# Patient Record
Sex: Female | Born: 1990 | Race: White | Hispanic: No | Marital: Married | Smoking: Never smoker
Health system: Southern US, Community
[De-identification: ages and names within clinical notes are randomized; demographics above are authoritative.]

---
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
## 2009-01-07 ENCOUNTER — Ambulatory Visit: Payer: Self-pay | Admitting: Family Medicine

## 2009-01-07 LAB — CONVERTED CEMR LAB: TSH: 1.935 microintl units/mL (ref 0.350–4.500)

## 2009-01-08 ENCOUNTER — Ambulatory Visit: Payer: Self-pay | Admitting: Radiology

## 2009-01-08 ENCOUNTER — Ambulatory Visit (HOSPITAL_BASED_OUTPATIENT_CLINIC_OR_DEPARTMENT_OTHER): Admission: RE | Admit: 2009-01-08 | Discharge: 2009-01-08 | Payer: Self-pay | Admitting: Family Medicine

## 2009-05-14 ENCOUNTER — Ambulatory Visit: Payer: Self-pay | Admitting: Family

## 2009-05-18 ENCOUNTER — Telehealth: Payer: Self-pay | Admitting: Family

## 2009-05-19 ENCOUNTER — Encounter (INDEPENDENT_AMBULATORY_CARE_PROVIDER_SITE_OTHER): Payer: Self-pay | Admitting: *Deleted

## 2009-07-18 ENCOUNTER — Ambulatory Visit: Payer: Self-pay | Admitting: Family

## 2009-12-29 ENCOUNTER — Ambulatory Visit: Payer: Self-pay | Admitting: Family

## 2010-01-08 ENCOUNTER — Ambulatory Visit: Payer: Self-pay | Admitting: Family

## 2010-01-08 ENCOUNTER — Ambulatory Visit (HOSPITAL_BASED_OUTPATIENT_CLINIC_OR_DEPARTMENT_OTHER): Admission: RE | Admit: 2010-01-08 | Discharge: 2010-01-08 | Payer: Self-pay | Admitting: Internal Medicine

## 2010-01-08 ENCOUNTER — Ambulatory Visit: Payer: Self-pay | Admitting: Diagnostic Radiology

## 2010-01-12 LAB — CONVERTED CEMR LAB
ALT: 11 units/L (ref 0–35)
AST: 17 units/L (ref 0–37)
Albumin: 4.2 g/dL (ref 3.5–5.2)
Alkaline Phosphatase: 61 units/L (ref 39–117)
Basophils Absolute: 0 10*3/uL (ref 0.0–0.1)
Basophils Relative: 1 % (ref 0–1)
Bilirubin, Direct: 0.1 mg/dL (ref 0.0–0.3)
EBV NA IgG: 1.98 — ABNORMAL HIGH
EBV VCA IgG: 5.94 — ABNORMAL HIGH
EBV VCA IgM: 0.52
Eosinophils Absolute: 0.2 10*3/uL (ref 0.0–0.7)
Eosinophils Relative: 2 % (ref 0–5)
HCT: 39.8 % (ref 36.0–46.0)
Hemoglobin: 13.1 g/dL (ref 12.0–15.0)
Indirect Bilirubin: 0.3 mg/dL (ref 0.0–0.9)
Lymphocytes Relative: 39 % (ref 12–46)
Lymphs Abs: 3.4 10*3/uL (ref 0.7–4.0)
MCHC: 32.9 g/dL (ref 30.0–36.0)
MCV: 85.8 fL (ref 78.0–100.0)
Monocytes Absolute: 0.5 10*3/uL (ref 0.1–1.0)
Monocytes Relative: 5 % (ref 3–12)
Neutro Abs: 4.5 10*3/uL (ref 1.7–7.7)
Neutrophils Relative %: 52 % (ref 43–77)
Platelets: 271 10*3/uL (ref 150–400)
RBC: 4.64 M/uL (ref 3.87–5.11)
RDW: 13.9 % (ref 11.5–15.5)
Total Bilirubin: 0.4 mg/dL (ref 0.3–1.2)
Total Protein: 8.1 g/dL (ref 6.0–8.3)
WBC: 8.7 10*3/uL (ref 4.0–10.5)

## 2010-01-13 ENCOUNTER — Telehealth: Payer: Self-pay | Admitting: Family

## 2010-01-13 ENCOUNTER — Telehealth: Payer: Self-pay | Admitting: Gastroenterology

## 2010-01-15 ENCOUNTER — Telehealth: Payer: Self-pay | Admitting: Family

## 2010-01-19 ENCOUNTER — Ambulatory Visit: Payer: Self-pay | Admitting: Family

## 2010-01-22 ENCOUNTER — Telehealth (INDEPENDENT_AMBULATORY_CARE_PROVIDER_SITE_OTHER): Payer: Self-pay | Admitting: *Deleted

## 2010-01-22 ENCOUNTER — Telehealth: Payer: Self-pay | Admitting: Family

## 2010-01-29 ENCOUNTER — Ambulatory Visit: Payer: Self-pay | Admitting: Gastroenterology

## 2010-01-29 ENCOUNTER — Ambulatory Visit (HOSPITAL_COMMUNITY): Admission: RE | Admit: 2010-01-29 | Discharge: 2010-01-29 | Payer: Self-pay | Admitting: Gastroenterology

## 2010-01-29 ENCOUNTER — Encounter: Payer: Self-pay | Admitting: Nurse Practitioner

## 2010-05-13 ENCOUNTER — Ambulatory Visit: Payer: Self-pay | Admitting: Family

## 2010-05-13 DIAGNOSIS — L732 Hidradenitis suppurativa: Secondary | ICD-10-CM | POA: Insufficient documentation

## 2010-05-13 LAB — CONVERTED CEMR LAB: Beta hcg, urine, semiquantitative: NEGATIVE

## 2010-06-08 ENCOUNTER — Telehealth: Payer: Self-pay | Admitting: Family

## 2010-06-19 ENCOUNTER — Ambulatory Visit
Admission: RE | Admit: 2010-06-19 | Discharge: 2010-06-19 | Payer: Self-pay | Source: Home / Self Care | Attending: Family | Admitting: Family

## 2010-06-19 DIAGNOSIS — J329 Chronic sinusitis, unspecified: Secondary | ICD-10-CM | POA: Insufficient documentation

## 2010-06-19 DIAGNOSIS — N751 Abscess of Bartholin's gland: Secondary | ICD-10-CM

## 2010-06-23 ENCOUNTER — Ambulatory Visit: Admit: 2010-06-23 | Payer: Self-pay | Admitting: Internal Medicine

## 2010-06-23 ENCOUNTER — Telehealth: Payer: Self-pay | Admitting: Internal Medicine

## 2010-07-21 NOTE — Assessment & Plan Note (Signed)
Summary: still sick on stomach maybe inner ear inf?/dt--Rm 5   Vital Signs:  Patient profile:   20 year old female Menstrual status:  regular Height:      67 inches Weight:      133 pounds BMI:     20.91 Temp:     98.0 degrees F oral Pulse rate:   72 / minute Pulse rhythm:   regular Resp:     16 per minute BP sitting:   112 / 80  (right arm) Cuff size:   regular  Vitals Entered By: Mervin Kung CMA Duncan Dull) (January 08, 2010 9:52 AM) CC: Room 5  Pt states she is still nauseated.  Also has intermittent ringing in her ears and has left ear pain this morning. Is Patient Diabetic? No   Primary Care Provider:  Lemont Fillers FNP  CC:  Room 5  Pt states she is still nauseated.  Also has intermittent ringing in her ears and has left ear pain this morning.Marland Kitchen  History of Present Illness: Heather Sparks is a 20 year old female who presents today with ongoing complaints of nausea.  Notes that nausea is worse at work and with activity.  Often worse after she eats.  She has not vomitted, had diarrhea or fever. Denies GERD symptoms. Though feels sweaty.  She has had c/o of abdominal pressure- but not clear pain. Abdominal discomfort is worse when she lays on her side.  She also complains of left ear pain.  She did not see ENT for this pain.    Allergies: 1)  ! Sulfa  Physical Exam  General:  Well-developed,well-nourished,in no acute distress; alert,appropriate and cooperative throughout examination Head:  Normocephalic and atraumatic without obvious abnormalities. No apparent alopecia or balding. Ears:  L TM partially occluded by cerumen- area that is visualized appears normal. L TM normal Mouth:  Oral mucosa and oropharynx without lesions or exudates.  Teeth in good repair. Lungs:  Normal respiratory effort, chest expands symmetrically. Lungs are clear to auscultation, no crackles or wheezes. Heart:  Normal rate and regular rhythm. S1 and S2 normal without gallop, murmur, click, rub or  other extra sounds. Abdomen:  Mild epigastric discomfort to deep palpation.  Mild discomfort to palpation of left mid abdomen.  Abdomen is soft, no guarding, + BS   Impression & Recommendations:  Problem # 1:  NAUSEA (ICD-787.02) Assessment Unchanged Will check LFT's, EBV panel, CBC and an abdominal ultrasound.  I am concerned about possible mono given fatigue, cervical adenopathy and L sided abdominal discomfort.  Check EBV panel.  Pt reports that she has never been sexually active.  Her updated medication list for this problem includes:    Zofran 4 Mg Tabs (Ondansetron hcl) ..... One tab by mouth every 8 hours as needed.  Orders: Misc. Referral (Misc. Ref) TLB-Hepatic/Liver Function Pnl (80076-HEPATIC) TLB-CBC Platelet - w/Differential (85025-CBCD) T-Epstein Barr Virus Antibody Panel I ([id number])  Problem # 2:  EAR PAIN, LEFT (ICD-388.70) Assessment: Unchanged Pt never went to ENT back in february- apparently scheduler could not reach her.  Will refer again.   Orders: ENT Referral (ENT)  Complete Medication List: 1)  Yaz 3-0.02 Mg Tabs (Drospirenone-ethinyl estradiol) .Marland Kitchen.. 1 tab by mouth daily as directed 2)  Zofran 4 Mg Tabs (Ondansetron hcl) .... One tab by mouth every 8 hours as needed.  Patient Instructions: 1)  You will be contacted about your referral to ENT. 2)  Please complete your lab work and ultrasound downstairs. 3)  Follow up  in 2 weeks.  Current Allergies (reviewed today): ! SULFA

## 2010-07-21 NOTE — Assessment & Plan Note (Signed)
Summary: NAUSEA X1 MONTH           (NEW TO GI)       Heather Sparks   History of Present Illness Visit Type: consult  Primary GI MD: Sheryn Bison MD FACP FAGA Primary Kohle Winner: Lemont Fillers FNP Requesting Houa Ackert: Lemont Fillers FNP Chief Complaint: Nausea, chest pain, headaches, and constipation  History of Present Illness:   Patient is a 20 year old female, new to GI, who presents with approx 2 month history of nausea. U/S, CBC, LFTs normal. Nauseated almost everyday, no vomiting. No dizziness. No weight loss. Nausea worse after meals. No GERD symptoms. Takes Advill once a week for headaches. Has taken Yaz for two years. Has never been sexually active. Started Vit C a week ago.  No other vitamins or herbs. Bowel movements normal.    GI Review of Systems    Reports nausea.      Denies abdominal pain, acid reflux, belching, bloating, chest pain, dysphagia with liquids, dysphagia with solids, heartburn, loss of appetite, vomiting, vomiting blood, weight loss, and  weight gain.        Denies anal fissure, black tarry stools, change in bowel habit, constipation, diarrhea, diverticulosis, fecal incontinence, heme positive stool, hemorrhoids, irritable bowel syndrome, jaundice, light color stool, liver problems, rectal bleeding, and  rectal pain. 0i   Current Medications (verified): 1)  Yaz 3-0.02 Mg Tabs (Drospirenone-Ethinyl Estradiol) .Marland Kitchen.. 1 Tab By Mouth Daily As Directed  Allergies (verified): 1)  ! Sulfa  Past History:  Past Medical History: G0 Unremarkable  Past Surgical History: Reviewed history from 01/07/2009 and no changes required. none  Family History: mother hypothyroidism father healthy 2 sisters healthy Family History of Diabetes: MGM Family History of Heart Disease: Paternal Side  No FH of Colon Cancer:  Social History: Reviewed history from 01/07/2009 and no changes required. Starting college at [employer]. Denies being sexually  active. Physically active, healthy diet. Denies ETOH or drug use.  Review of Systems       The patient complains of back pain, depression-new, fatigue, headaches-new, and urination - excessive.  The patient denies allergy/sinus, anemia, anxiety-new, arthritis/joint pain, blood in urine, breast changes/lumps, change in vision, confusion, cough, coughing up blood, fainting, fever, hearing problems, heart murmur, heart rhythm changes, itching, menstrual pain, muscle pains/cramps, night sweats, nosebleeds, pregnancy symptoms, shortness of breath, skin rash, sleeping problems, sore throat, swelling of feet/legs, swollen lymph glands, thirst - excessive , urination - excessive , urination changes/pain, urine leakage, vision changes, and voice change.    Vital Signs:  Patient profile:   20 year old female Menstrual status:  regular Height:      67 inches Weight:      134 pounds BMI:     21.06 BSA:     1.71 Pulse rate:   64 / minute Pulse rhythm:   regular BP sitting:   98 / 60  (left arm) Cuff size:   regular  Vitals Entered By: Ok Anis CMA (January 29, 2010 9:39 AM)  Physical Exam  General:  Well developed, well nourished, no acute distress. Head:  Normocephalic and atraumatic. Eyes:  Conjunctiva pink, no icterus.  Neck:  no obvious masses  Lungs:  Clear throughout to auscultation. Heart:  Regular rate and rhythm; no murmurs, rubs,  or bruits. Abdomen:  Abdomen soft, nontender, nondistended. No obvious masses or hepatomegaly.Normal bowel sounds.  Msk:  . Extremities:  No palmar erythema, no edema.  Neurologic:  Alert and  oriented x4;  grossly normal neurologically. Skin:  Intact without significant lesions or rashes. Cervical Nodes:  No significant cervical adenopathy. Psych:  Alert and cooperative. Normal mood and affect.   Impression & Recommendations:  Problem # 1:  NAUSEA (ICD-787.02) Assessment Deteriorated Two month history of nausea without vomiting. Some, but no  significant use of NSAIDS. No associated dizziness. She has never been sexually active so not pregnant. On birth control pills for two years without prior nausea. U/S and labs are normal. Patient is moving to [place] on Sunday so we are extremely limited in the amount of time available for workup. Dr. Arlyce Dice has offered to do an EGD today. If negative, patient can return to [facility] for further workup or establish care with a GI in [place] where she will be attending college;.   Orders: ZEGD (ZEGD)  Patient Instructions: 1)  Instructed the pt to arrive at [facility] at 11:30 AM. 2)  Have nothing to eat or drink until after your procedure today.

## 2010-07-21 NOTE — Letter (Signed)
Summary: EGD Instructions  [facility]  [address] [place], [place] [address]   Phone: [phone]  Fax: [phone]       Heather Sparks    1991/02/08    MRN: [id number]       Procedure Day /Date :01-29-10     Arrival Time: 11:30 AM     Procedure Time:  12:30 PM      Location of Procedure:                     X  _ [facility] ( Outpatient Registration) PREPARATION FOR ENDOSCOPY   On 8-11-11THE DAY OF THE PROCEDURE:  1.   No solid foods, milk or milk products are allowed after midnight the night before your procedure.  2.   Do not drink anything colored red or purple.  Avoid juices with pulp.  No orange juice.  3.  Do not drink anything until after the procedure today.  Arrive at 11:30 AM to [facility] patient check in.                                                                                                 CLEAR LIQUIDS INCLUDE: Water Jello Ice Popsicles Tea (sugar ok, no milk/cream) Powdered fruit flavored drinks Coffee (sugar ok, no milk/cream) Gatorade Juice: apple, white grape, white cranberry  Lemonade Clear bullion, consomm, broth Carbonated beverages (any kind) Strained chicken noodle soup Hard Candy   MEDICATION INSTRUCTIONS  Unless otherwise instructed, you should take regular prescription medications with a small sip of water as early as possible the morning of your procedure.         OTHER INSTRUCTIONS  You will need a responsible adult at least 21 years of age to accompany you and drive you home.   This person must remain in the waiting room during your procedure.  Wear loose fitting clothing that is easily removed.  Leave jewelry and other valuables at home.  However, you may wish to bring a book to read or an iPod/MP3 player to listen to music as you wait for your procedure to start.  Remove all body piercing jewelry and leave at home.  Total time from sign-in until discharge is approximately 2-3  hours.  You should go home directly after your procedure and rest.  You can resume normal activities the day after your procedure.  The day of your procedure you should not:   Drive   Make legal decisions   Operate machinery   Drink alcohol   Return to work  You will receive specific instructions about eating, activities and medications before you leave.    The above instructions have been reviewed and explained to me by   _______________________    I fully understand and can verbalize these instructions _____________________________ Date _________

## 2010-07-21 NOTE — Assessment & Plan Note (Signed)
Summary: EAR ACHE AND CANT HEAR OUT OF ONE EAR/MHF   Vital Signs:  Patient profile:   20 year old female Menstrual status:  regular Weight:      134 pounds Temp:     98.1 degrees F oral BP sitting:   112 / 68  (left arm)  Vitals Entered By: Doristine Devoid (July 18, 2009 8:37 AM) CC: L ear ache having trouble hearing   20db HL: Left  500 hz: No Response 1000 hz: 20db 2000 hz: 20db 4000 hz: 20db Right  500 hz: No Response 1000 hz: No Response 2000 hz: 20db 4000 hz: 20db  25db HL: Left  500 hz: No Response 1000 hz: 25db 2000 hz: 25db 4000 hz: 25db Right  500 hz: No Response 1000 hz: 25db 2000 hz: 25db 4000 hz: 25db    Primary Care Provider:  Seymour Bars DO  CC:  L ear ache having trouble hearing .  History of Present Illness: Heather Sparks is a 20 year old female who presents today with c/o of left ear pain and difficulty hearing out of her left ear.  She was seen in the end of November for the same symptoms and was treated with antibiotics.  This did not significantly improve her hearing.  Today she is having difficulty hearing out of her left ear as well as having pain.  Notes that she is also scheduled to have her wisdom teeth extracted in the near future- she is having jaw pain as well.    Allergies: 1)  ! Sulfa  Review of Systems       Denies fever of ear drainage  Physical Exam  General:  Well-developed,well-nourished,in no acute distress; alert,appropriate and cooperative throughout examination Ears:  L canal with thick purulence on walls- pain with light manipulation of left Pinna, unable to visualize L TM due to discomfort and narrowing of canal.  R TM partially occluded by cerumen, no erythema noted.   Mouth:  Oral mucosa and oropharynx without lesions or exudates.  Teeth in good repair. Neck:  No deformities, masses, or tenderness noted. Lungs:  Normal respiratory effort, chest expands symmetrically. Lungs are clear to auscultation, no crackles or  wheezes. Heart:  Normal rate and regular rhythm. S1 and S2 normal without gallop, murmur, click, rub or other extra sounds.   Impression & Recommendations:  Problem # 1:  EXTERNAL OTITIS (ICD-380.10) Assessment New  I had a difficult time visualizing L TM due to ear canal narrowing and patient discomfort.  Will treat empirically for OM as well.  Patient instructed to use ciprodex drops in left ear two times a day.  Will also refer to ENT for further evaluation as patient's c/o of left ear pain and hearing difficulties have been ongoing x 2 months.  Patient instructed to call if symptoms worsen or do not improve.   Her updated medication list for this problem includes:    Ciprodex 0.3-0.1 % Susp (Ciprofloxacin-dexamethasone) .Marland KitchenMarland KitchenMarland KitchenMarland Kitchen 4 drops in each ear twice daily x 7 days  Orders: Audiometry [phone]) ENT Referral (ENT)  Complete Medication List: 1)  Yaz 3-0.02 Mg Tabs (Drospirenone-ethinyl estradiol) .Marland Kitchen.. 1 tab by mouth daily as directed 2)  Augmentin 500-125 Mg Tabs (Amoxicillin-pot clavulanate) .... One tab by mouth two times a day x 10 days 3)  Ciprodex 0.3-0.1 % Susp (Ciprofloxacin-dexamethasone) .... 4 drops in each ear twice daily x 7 days  Patient Instructions: 1)  You will be contacted about your appointment with the Ear/Nose/throat specialist.   2)  Antibiotics can reduce the effectiveness of your birth control pill.  Use condoms as a back up form of birth control and for protection against STD's.    Prescriptions: YAZ 3-0.02 MG TABS (DROSPIRENONE-ETHINYL ESTRADIOL) 1 tab by mouth daily as directed  #3 x 1   Entered and Authorized by:   Lemont Fillers FNP   Signed by:   Doristine Devoid on 07/18/2009   Method used:   [facility] to        [facility] [phone]* (retail)       [address]       [place], [place]  [address]       Ph: [phone]       Fax: [phone]   RxID:   [id number] CIPRODEX 0.3-0.1 % SUSP  (CIPROFLOXACIN-DEXAMETHASONE) 4 drops in each ear twice daily x 7 days  #1 x 0   Entered and Authorized by:   Lemont Fillers FNP   Signed by:   Doristine Devoid on 07/18/2009   Method used:   Electronically to        [facility] [phone]* (retail)       [address]       [place], [place]  [address]       Ph: [phone]       Fax: [phone]   RxID:   [id number] AUGMENTIN 500-125 MG TABS (AMOXICILLIN-POT CLAVULANATE) one tab by mouth two times a day x 10 days  #20 x 0   Entered and Authorized by:   Lemont Fillers FNP   Signed by:   Doristine Devoid on 07/18/2009   Method used:   Electronically to        [facility] [phone]* (retail)       [address]       [place], [place]  [address]       Ph: [phone]       Fax: [phone]   RxID:   [id number]

## 2010-07-21 NOTE — Progress Notes (Signed)
Summary: Triage  Phone Note From Other Clinic   Caller: [facility] @ Dr Artist Pais [phone] Reason for Call: Schedule Patient Appt Summary of Call: Nausea X1 month, getting worse. Initial call taken by: Leanor Kail [facility],  January 22, 2010 11:05 AM  Follow-up for Phone Call        Pt. can see Mike Gip PAC on 01-23-10 at 9:30am. Myriam Jacobson will advise pt. of appt./medlist/copay/cx.policy. Follow-up by: Laureen Ochs LPN,  January 22, 2010 12:26 PM     Appended Document: Triage Per Myriam Jacobson, pt. cannot be seen until 01-29-10, she is going out of town. She will see Willette Cluster NP on 01-29-10 at 9:30am.

## 2010-07-21 NOTE — Assessment & Plan Note (Signed)
Summary: wants to change bcp / tf,cma--Rm 4   Vital Signs:  Patient profile:   20 year old female Menstrual status:  regular Height:      67 inches Weight:      133.50 pounds BMI:     20.98 Temp:     97.7 degrees F oral Pulse rate:   72 / minute Pulse rhythm:   regular Resp:     18 per minute BP sitting:   90 / 70  (right arm) Cuff size:   regular  Vitals Entered By: Mervin Kung CMA Duncan Dull) (May 13, 2010 3:44 PM) CC: Pt states she has stopped Yaz x 2 months. Nausea has resolved. Would like to start new BCP med. Is Patient Diabetic? No Pain Assessment Patient in pain? no        Primary Care Provider:  Lemont Fillers FNP  CC:  Pt states she has stopped Yaz x 2 months. Nausea has resolved. Would like to start new BCP med.Marland Kitchen  History of Present Illness: Ms Sibyl Parr is a 20 year old female who presents today requesting a change in her OCP.  She reports that she has been off of YAZ x 2  months.  Prior to that time she was experiencing nausea.  Went off the YAZ briefly, nausea resolved.  Then resumed and within 5 days was having nausea again.  LMP started 6 days ago- just ended.  Reports that she is not sexually active.  Uses birth control to help with her acne and also "Just in case" she becomes sexually active.     Also has question about pimples that she gets under her arms.    Allergies: 1)  ! Sulfa  Past History:  Past Medical History: Last updated: 01/29/2010 G0 Unremarkable  Past Surgical History: Last updated: 01/07/2009 none  Review of Systems       see HPI  Physical Exam  General:  Well-developed,well-nourished,in no acute distress; alert,appropriate and cooperative throughout examination Head:  Normocephalic and atraumatic without obvious abnormalities. No apparent alopecia or balding. Lungs:  Normal respiratory effort, chest expands symmetrically. Lungs are clear to auscultation, no crackles or wheezes. Heart:  Normal rate and regular  rhythm. S1 and S2 normal without gallop, murmur, click, rub or other extra sounds. Skin:  small pimple noted in left axilla.     Impression & Recommendations:  Problem # 1:  CONTRACEPTIVE MANAGEMENT (ICD-V25.09) Assessment Comment Only Urine pregancy test is negative.  Will try microgestin which has lower estrogen than the yaz.  Pt to calll if she develops nausea on this pill. Orders: Urine Pregnancy Test  (57846)  Problem # 2:  HIDRADENITIS SUPPURATIVA (ICD-705.83) Assessment: Comment Only Mild, small pimple in left axilla does not warrant abx at this time, however patient was instructed to call us if area becomes larger or more painful.    Complete Medication List: 1)  Microgestin Fe 1.5/30 1.5-30 Mg-mcg Tabs (Norethin ace-eth estrad-fe) .... Take as directed  Patient Instructions: 1)  Start your pill today. 2)  Call if you develop nausea while taking this medication. 3)  Use back up birth control this cycle.  4)  Follow up in 6 months, sooner if problems or concerns. Prescriptions: MICROGESTIN FE 1.5/30 1.5-30 MG-MCG TABS (NORETHIN ACE-ETH ESTRAD-FE) take as directed  #1 x 5   Entered and Authorized by:   Lemont Fillers FNP   Signed by:   Lemont Fillers FNP on 05/13/2010   Method used:   Electronically to  [facility] [phone]* (retail)       [address]       [place], [place]  [address]       Ph: [phone]       Fax: [phone]   RxID:   [id number]    Orders Added: 1)  Urine Pregnancy Test  [81025] 2)  Est. Patient Level III [[address]]    Current Allergies (reviewed today): ! SULFA  Laboratory Results   Urine Tests   Date/Time Reported: Mervin Kung CMA Duncan Dull)  May 13, 2010 4:03 PM     Urine HCG: negative

## 2010-07-21 NOTE — Assessment & Plan Note (Signed)
Summary: NEEDS REFILL ON  BCP  /PAP-Rm 4   Vital Signs:  Patient profile:   20 year old female Menstrual status:  regular Height:      67 inches Weight:      130.50 pounds BMI:     20.51 Temp:     97.8 degrees F oral Pulse rate:   78 / minute Pulse rhythm:   regular Resp:     18 per minute BP sitting:   118 / 80  (right arm) Cuff size:   regular  Vitals Entered By: Heather Sparks) (December 29, 2009 1:37 PM) CC: Pt needs refill on Yaz (needs generic).  Needs pap smear.   Also having n/v & diarrhea x 2 days. Is Patient Diabetic? No Pain Assessment Patient in pain? no      Comments Pt has completed Augmentin and Ciprodex.   Primary Care Provider:  Seymour Bars DO  CC:  Pt needs refill on Yaz (needs generic).  Needs pap smear.   Also having n/v & diarrhea x 2 days.Marland Kitchen  History of Present Illness: Heather Sparks is a 20 year old female who presents today with complaint of nausea, vomitting and diarrhea x 2 days.   Boyfriend also is sick.  She did keep soup down last night,  yesterday had 2-3 episodes of diarrhea yesterday, once today.  Denies fever though has felt "hot and cold."  Allergies: 1)  ! Sulfa  Physical Exam  General:  Tired appearing white female in NAD Lungs:  Normal respiratory effort, chest expands symmetrically. Lungs are clear to auscultation, no crackles or wheezes. Heart:  Normal rate and regular rhythm. S1 and S2 normal without gallop, murmur, click, rub or other extra sounds. Abdomen:  Bowel sounds positive but hypoactive, abdomen soft and with mild generalized tenderness.  No masses, organomegaly or hernias noted.   Impression & Recommendations:  Problem # 1:  GASTROENTERITIS, ACUTE (ICD-558.9) Assessment New Will treat with zofran, encouraged fluids.  Patient with known sick contacts.  Suspect acute viral gastroenteritis Her updated medication list for this problem includes:    Zofran 4 Mg Tabs (Ondansetron hcl) ..... One tab by mouth every 8  hours as needed.  Problem # 2:  CONTRACEPTIVE MANAGEMENT (ICD-V25.09) Assessment: Comment Only Patient has never been sexually active.  New guidelines recommend starting paps at age 2.  Using Yaz, "just in case" she becomes sexually active and to help regulate her periods.  Refill provided.  Complete Medication List: 1)  Yaz 3-0.02 Mg Tabs (Drospirenone-ethinyl estradiol) .Marland Kitchen.. 1 tab by mouth daily as directed 2)  Zofran 4 Mg Tabs (Ondansetron hcl) .... One tab by mouth every 8 hours as needed.  Patient Instructions: 1)  Call if your symptoms worsen or have not improved in 48 hours. 2)  Drink LOTS of fluid. Prescriptions: ZOFRAN 4 MG TABS (ONDANSETRON HCL) one tab by mouth every 8 hours as needed.  #15 x 0   Entered and Authorized by:   Heather Sparks   Signed by:   Heather Sparks on 12/29/2009   Method used:   Electronically to        [facility] [phone]* (retail)       [address]       [place], [place]  [address]       Ph: [phone]       Fax: [phone]   RxID:   [id number] YAZ 3-0.02 MG  TABS (DROSPIRENONE-ETHINYL ESTRADIOL) 1 tab by mouth daily as directed  #3 x 3   Entered and Authorized by:   Heather Sparks   Signed by:   Heather Sparks on 12/29/2009   Method used:   Electronically to        [facility] [phone]* (retail)       [address]       [place], [place]  [address]       Ph: [phone]       Fax: [phone]   RxID:   [id number]   Current Allergies (reviewed today): ! SULFA

## 2010-07-21 NOTE — Progress Notes (Signed)
Summary: Wants to see GI today  Phone Note Call from Patient Call back at [phone] cell   Caller: Patient Summary of Call: Patient called stating that she is feeling worse now.  Has nausea and headache. Wants to know if we can get her in with GI group today. Nicki Guadalajara Fergerson CMA Duncan Dull)  January 22, 2010 8:59 AM   Follow-up for Phone Call        [facility] appt August 5 @ 9:30, called pt she is going out of town until Thursday , Appt  August 11 @ 9:30 pt notified  Follow-up by: Darral Dash,  January 22, 2010 12:32 PM

## 2010-07-21 NOTE — Progress Notes (Signed)
Summary: Appt with Dr Christella Hartigan  Phone Note Call from Patient   Caller: Patient Summary of Call: Pt called to let us know she is cancelling appt with  Dr Christella Hartigan Initial call taken by: Lannette Donath,  January 15, 2010 3:11 PM

## 2010-07-21 NOTE — Progress Notes (Signed)
  Phone Note Outgoing Call      

## 2010-07-21 NOTE — Procedures (Signed)
Summary: Upper Endoscopy  Patient: Heather Sparks Note: All result statuses are Final unless otherwise noted.  Tests: (1) Upper Endoscopy (EGD)   EGD Upper Endoscopy       DONE     [facility]     [address] [place], [place]  [address]           ENDOSCOPY PROCEDURE REPORT           PATIENT:  Heather, Sparks  MR#:  [id number]     BIRTHDATE:  September 15, 1990, 18 yrs. old  GENDER:  female           ENDOSCOPIST:  Barbette Hair. Arlyce Dice, MD     Referred by:           PROCEDURE DATE:  01/29/2010     PROCEDURE:  EGD, diagnostic     ASA CLASS:  Class I     INDICATIONS:  nausea           MEDICATIONS:   Fentanyl 75 mcg IV, Versed 10 mg IV, Benadryl 25 mg     IV, glycopyrrolate (Robinal) 0.2 mg IV     TOPICAL ANESTHETIC:  Cetacaine Spray           DESCRIPTION OF PROCEDURE:   After the risks benefits and     alternatives of the procedure were thoroughly explained, informed     consent was obtained.  The Pentax Gastroscope [id number] endoscope     was introduced through the mouth and advanced to the third portion     of the duodenum, without limitations.  The instrument was slowly     withdrawn as the mucosa was fully examined.     <<PROCEDUREIMAGES>>           The upper, middle, and distal third of the esophagus were     carefully inspected and no abnormalities were noted. The z-line     was well seen at the GEJ. The endoscope was pushed into the fundus     which was normal including a retroflexed view. The antrum,gastric     body, first and second part of the duodenum were unremarkable (see     image1, image2, image3, image5, image6, image7, and image8).     Retroflexed views revealed no abnormalities.    The scope was then     withdrawn from the patient and the procedure completed.     COMPLICATIONS:  None           ENDOSCOPIC IMPRESSION:     1) Normal EGD     RECOMMENDATIONS:     1) Call office next 2-3 days to schedule an office appointment     for 1 month     2) hold  Yaz for 1 week           REPEAT EXAM:  No           ______________________________     Barbette Hair. Arlyce Dice, MD           CC:  Sandford Craze FNP, Sheryn Bison, MD           n.     Rosalie Doctor:   Barbette Hair. Kaplan at 01/29/2010 01:08 PM           Blanchie Serve, [id number]  Note: An exclamation mark (!) indicates a result that was not dispersed into the flowsheet. Document Creation Date: 01/29/2010 1:10 PM _______________________________________________________________________  (1) Order result status: Final Collection or observation date-time: 01/29/2010  13:02 Requested date-time:  Receipt date-time:  Reported date-time:  Referring Physician:   Ordering Physician: Melvia Heaps [phone]) Specimen Source:  Source: Launa Grill Order Number: [id number] Lab site:

## 2010-07-21 NOTE — Progress Notes (Signed)
Summary: triage  Phone Note From Other Clinic Call back at [phone]   Caller: Myriam Jacobson [facility] Call For: Dr. Christella Hartigan Reason for Call: Schedule Patient Appt Summary of Call: Dr. Artist Pais would like pt worked in for nausea x 1 month Initial call taken by: Vallarie Mare,  January 13, 2010 3:09 PM  Follow-up for Phone Call        Dr.Yoo's office ntfd. that when supervising schedule becomes avaiable at the end of the wk. we will call them and set up appt. as Dr.Eulah Walkup was the requsted Dr. and if he is not available soon they will ask the dr.if someone else can see pt. Follow-up by: Teryl Lucy RN,  January 14, 2010 9:12 AM  Additional Follow-up for Phone Call Additional follow up Details #1::        Pt scheduled with Dr Christella Hartigan 01/16/10  Myriam Jacobson will notify pt Additional Follow-up by: Chales Abrahams CMA (AAMA),  January 15, 2010 10:02 AM

## 2010-07-21 NOTE — Procedures (Signed)
Summary: Instructions for procedure/[place]  Instructions for procedure/[place]   Imported By: Sherian Rein 02/02/2010 09:42:18  _____________________________________________________________________  External Attachment:    Type:   Image     Comment:   External Document

## 2010-07-21 NOTE — Assessment & Plan Note (Signed)
Summary: 2 week follow up/mhf   Vital Signs:  Patient profile:   20 year old female Menstrual status:  regular Weight:      136 pounds BMI:     21.38 Temp:     98.5 degrees F oral Pulse rate:   66 / minute Pulse rhythm:   regular Resp:     16 per minute BP sitting:   100 / 70  (left arm) Cuff size:   regular  Vitals Entered By: Glendell Docker CMA (January 19, 2010 11:09 AM) CC: 1 Week Follow up Is Patient Diabetic? No Pain Assessment Patient in pain? no       Does patient need assistance? Functional Status Self care Ambulation Normal Comments symptoms improved no concerns   Primary Care Provider:  Lemont Fillers FNP  CC:  1 Week Follow up.  History of Present Illness: Ms Heather Sparks is a 20 year old female who presents today for follow up of her nausea.  Last visit she noted an approximately 1 month history of nausea.  Abdominal ultrasounds and lab work was unremarkable.  She was referred to GI.  Pt cancelled GI apt.  Notes improvement in her nausea since she has made arrangement for air conditioning in her room.    Ear pain is improved,  hearing issues are improved.  Notes that she saw ENT . and had cerumen removed and was given rx for an otic antibiotic.    Preventive Screening-Counseling & Management  Alcohol-Tobacco     Smoking Status: never  Allergies: 1)  ! Sulfa  Social History: Smoking Status:  never  Physical Exam  General:  Well-developed,well-nourished,in no acute distress; alert,appropriate and cooperative throughout examination Head:  Normocephalic and atraumatic without obvious abnormalities. No apparent alopecia or balding. Ears:  External ear exam shows no significant lesions or deformities.  Otoscopic examination reveals clear canals, tympanic membranes are intact bilaterally without bulging, retraction, inflammation or discharge. Hearing is grossly normal bilaterally. Lungs:  Normal respiratory effort, chest expands symmetrically. Lungs are  clear to auscultation, no crackles or wheezes. Heart:  Normal rate and regular rhythm. S1 and S2 normal without gallop, murmur, click, rub or other extra sounds.   Impression & Recommendations:  Problem # 1:  NAUSEA (ICD-787.02) Assessment Improved Symptoms improved since patient has started air conditioning her bedroom.  Recommended that she stay well hydrated and that she call if she develops recurrent nausea.  The following medications were removed from the medication list:    Zofran 4 Mg Tabs (Ondansetron hcl) ..... One tab by mouth every 8 hours as needed.  Problem # 2:  EAR PAIN, LEFT (ICD-388.70) Assessment: Improved Saw ENT, resolved.   Complete Medication List: 1)  Yaz 3-0.02 Mg Tabs (Drospirenone-ethinyl estradiol) .Marland Kitchen.. 1 tab by mouth daily as directed  Patient Instructions: 1)  Please call if you develop recurrent nausea. 2)  Stay well hydrated.  Current Allergies (reviewed today): ! SULFA

## 2010-07-23 ENCOUNTER — Telehealth: Payer: Self-pay | Admitting: Family

## 2010-07-23 NOTE — Assessment & Plan Note (Signed)
Summary: FOLLOW UP/MHF--Rm 5   Vital Signs:  Patient profile:   20 year old female Menstrual status:  regular LMP:     06/15/2010 Height:      67 inches Weight:      134 pounds BMI:     21.06 Temp:     97.8 degrees F oral Pulse rate:   72 / minute Pulse rhythm:   regular Resp:     16 per minute BP sitting:   82 / 62  (right arm) Cuff size:   regular  Vitals Entered By: Mervin Kung CMA Duncan Dull) (June 19, 2010 9:44 AM) CC: Pt states she noticed a ?cyst in her vagina x 2 days. Is Patient Diabetic? No Pain Assessment Patient in pain? no      Comments Pt agrees all med doses and directions are correct. Nicki Guadalajara Fergerson CMA Duncan Dull)  June 19, 2010 9:49 AM  LMP (date): 06/15/2010     Enter LMP: 06/15/2010   Primary Care Provider:  Lemont Fillers FNP  CC:  Pt states she noticed a ?cyst in her vagina x 2 days.Marland Kitchen  History of Present Illness: Ms.  Heather Sparks is a 20 year old female who presents today for followup.  #1. Contraceptive management-the patient was placed on Microgestin last visit. She had previously been on gas and had issues with chronic nausea. She reports that she has been tolerating this without difficulty.  #2. Vaginal cyst-the patient notes a questionable cyst in the vaginal area which has been present for approximately one week. She does not note significant pain except when trying to place a tampon. she has had one sexual partner since her last visit.  #3.Sinus drainage-the patient notes that she has had a lot of mucus in the back of her throat hurting down her sinuses. She denies any associated fever.  Allergies: 1)  ! Sulfa  Past History:  Past Medical History: Last updated: 01/29/2010 G0 Unremarkable  Review of Systems       see history of present illness  Physical Exam  General:  Well-developed,well-nourished,in no acute distress; alert,appropriate and cooperative throughout examination Eyes:  PERRLA sclera are clear Ears:   External ear exam shows no significant lesions or deformities.  Otoscopic examination reveals clear canals, tympanic membranes are intact bilaterally without bulging, retraction, inflammation or discharge. Hearing is grossly normal bilaterally. Mouth:  Oral mucosa and oropharynx without lesions or exudates.  Teeth in good repair. Lungs:  Normal respiratory effort, chest expands symmetrically. Lungs are clear to auscultation, no crackles or wheezes. Heart:  Normal rate and regular rhythm. S1 and S2 normal without gallop, murmur, click, rub or other extra sounds. Genitalia:  the patient is noted to have an approximately 1.5 cm area of fluctuance at the base of the right labia by the introitus. This is minimally tender and there is no associated erythema.   Impression & Recommendations:  Problem # 1:  ABSCESS OF BARTHOLINS GLAND (ICD-616.3) Assessment New suspect early abscess a Bartholin's gland. She does not have significant associated erythema there is no drainage. We'll plan treatment with doxycycline and will refer to GYN for possible I&D. I have advised her in the meantime to do warm tub tub soaks twice daily. Orders: Gynecologic Referral (Gyn)  Problem # 2:  CONTRACEPTIVE MANAGEMENT (ICD-V25.09) Assessment: Comment Only the patient is tolerating Microgestin without difficulty. I did advise her to use backup protection during and after use of doxycycline.  Problem # 3:  SINUSITIS (ICD-473.9) Assessment: New she may have an early sinusitis  this should be covered by the doxycycline which we are using to treat #1. Her updated medication list for this problem includes:    Doxycycline Hyclate 100 Mg Caps (Doxycycline hyclate) ..... One tablet by mouth two times a day for 7 days  Complete Medication List: 1)  Microgestin Fe 1.5/30 1.5-30 Mg-mcg Tabs (Norethin ace-eth estrad-fe) .... Take as directed 2)  Doxycycline Hyclate 100 Mg Caps (Doxycycline hyclate) .... One tablet by mouth two times a  day for 7 days  Patient Instructions: 1)  You will be contacted about your referral to GYN. Prescriptions: DOXYCYCLINE HYCLATE 100 MG CAPS (DOXYCYCLINE HYCLATE) one tablet by mouth two times a day for 7 days  #14 x 0   Entered and Authorized by:   Lemont Fillers FNP   Signed by:   Lemont Fillers FNP on 06/19/2010   Method used:   [facility] to        [facility] [phone]* (retail)       [address]       [place], [place]  [address]       Ph: [phone]       Fax: [phone]   RxID:   [id number] BACTRIM DS 800-160 MG TABS (SULFAMETHOXAZOLE-TRIMETHOPRIM) one tablet by mouth two times a day for 7 days  #14 x 0   Entered and Authorized by:   Lemont Fillers FNP   Signed by:   Lemont Fillers FNP on 06/19/2010   Method used:   Electronically to        [facility] [phone]* (retail)       [address]       [place], [place]  [address]       Ph: [phone]       Fax: [phone]   RxID:   [id number]    Orders Added: 1)  Gynecologic Referral [Gyn] 2)  Est. Patient Level III [[address]]    Current Allergies (reviewed today): ! SULFA

## 2010-07-23 NOTE — Progress Notes (Signed)
Summary: refill-- microgestin  Phone Note Refill Request Call back at [phone] Message from:  Patient on June 08, 2010 4:54 PM  Refills Requested: Medication #1:  MICROGESTIN FE 1.5/30 1.5-30 MG-MCG TABS take as directed.   Dosage confirmed as above?Dosage Confirmed   Supply Requested: 3 months   Last Refilled: 05/13/2010 Pt is requesting 3 month supply instead of 1 month. Please advise.   Next Appointment Scheduled: none Initial call taken by: Mervin Kung CMA Duncan Dull),  June 08, 2010 4:55 PM  Follow-up for Phone Call        OK to give 3 month supply with 1 refill. Follow-up by: Lemont Fillers FNP,  June 08, 2010 4:59 PM  Additional Follow-up for Phone Call Additional follow up Details #1::        Pt notified. Nicki Guadalajara Fergerson CMA Duncan Dull)  June 09, 2010 8:46 AM     Prescriptions: MICROGESTIN FE 1.5/30 1.5-30 MG-MCG TABS (NORETHIN ACE-ETH ESTRAD-FE) take as directed  #3 x 1   Entered by:   Mervin Kung CMA (AAMA)   Authorized by:   Lemont Fillers FNP   Signed by:   Mervin Kung CMA (AAMA) on 06/09/2010   Method used:   Electronically to        [facility] [phone]* (retail)       [address]       [place], [place]  [address]       Ph: [phone]       Fax: [phone]   RxID:   [id number]

## 2010-07-23 NOTE — Progress Notes (Signed)
Summary: [facility] WITH GYNECOLOGIST   Phone Note From Other Clinic   Caller: DR [facility] Call For: OSULLIVAN  Summary of Call: DR SCHOENHOFF CALLED TO SAY THAT SHE THINKS WE MISUNDERSTOOD WHAT TYPE OF DOCTOR SHE IS.  SHE IS NOT GYNECOLOGY SHE IS INTERNAL MEDICINE.  SHE WILL SEE PATIENT WITH HORMONE PROBLEMS ETC, BUT NOT CYSTS ETC.  CALLED PATIENT AND ADVISED HER THE APPT FOR TODAY IS CANCELLED AND THAT HELEN WOULD MAKE HER AN APPT WITH A GYNECOLOGIST  AND CALL HER WITH THE INFORMATION.   PATIENT STATED SHE PREFERS [place]  Initial call taken by: Roselle Locus,  June 23, 2010 9:05 AM  Follow-up for Phone Call        appt with Dr Jennette Kettle [facility] 1-5- @ 200 spoke with patient Roselle Locus  June 23, 2010 2:18 PM

## 2010-08-06 NOTE — Progress Notes (Signed)
Summary: Prefers female GYN  Phone Note Call from Patient Call back at [phone]   Caller: Patient Call For: Lemont Fillers FNP Summary of Call: Pt left voice message requesting to see a female gynecologist. Pt states it is ok if she has to see someone outside of the group she was originally referred to. Please advise.  Initial call taken by: Mervin Kung CMA Duncan Dull),  July 23, 2010 10:47 AM  Follow-up for Phone Call        Could you please call [facility] and see if we can schedule her follow up with one of their female providers? Thanks Follow-up by: Lemont Fillers FNP,  July 23, 2010 2:41 PM  Additional Follow-up for Phone Call Additional follow up Details #1::        Appt scheduled with Julio Sicks tomorrow at 10am. Left detailed message re: appt time and # [phone] to call if pt needs to r/s.  Additional Follow-up by: Mervin Kung CMA Duncan Dull),  July 29, 2010 3:26 PM

## 2010-09-25 ENCOUNTER — Other Ambulatory Visit: Payer: Self-pay | Admitting: Obstetrics and Gynecology

## 2010-09-25 ENCOUNTER — Ambulatory Visit (HOSPITAL_COMMUNITY)
Admission: RE | Admit: 2010-09-25 | Discharge: 2010-09-25 | Disposition: A | Discharge status: Home / Self Care | Payer: 59 | Source: Ambulatory Visit | Attending: Obstetrics and Gynecology | Admitting: Obstetrics and Gynecology

## 2010-09-25 DIAGNOSIS — N750 Cyst of Bartholin's gland: Secondary | ICD-10-CM | POA: Insufficient documentation

## 2010-09-25 HISTORY — PX: BARTHOLIN GLAND CYST EXCISION: SHX565

## 2010-09-25 LAB — CBC
HCT: 39.6 % (ref 36.0–46.0)
Hemoglobin: 13 g/dL (ref 12.0–15.0)
MCH: 29 pg (ref 26.0–34.0)
MCHC: 32.8 g/dL (ref 30.0–36.0)
MCV: 88.4 fL (ref 78.0–100.0)
Platelets: 257 K/uL (ref 150–400)
RBC: 4.48 MIL/uL (ref 3.87–5.11)
RDW: 13 % (ref 11.5–15.5)
WBC: 8.3 K/uL (ref 4.0–10.5)

## 2010-09-25 LAB — PREGNANCY, URINE: Preg Test, Ur: NEGATIVE

## 2010-09-27 NOTE — Op Note (Signed)
  NAMEKELAIAH, ESCALONA              ACCOUNT NO.:  [id number]  MEDICAL RECORD NO.:  [id number]           PATIENT TYPE:  O  LOCATION:  [facility]                          [facility]  PHYSICIAN:  Jerolyn Flenniken L. Javona Bergevin, M.D.DATE OF BIRTH:  02-Oct-1990  DATE OF PROCEDURE:  09/25/2010 DATE OF DISCHARGE:                              OPERATIVE REPORT   PREOPERATIVE DIAGNOSIS:  Right Bartholin cyst.  POSTOPERATIVE DIAGNOSIS:  Right Bartholin cyst.  PROCEDURE:  Right Bartholin cystectomy.  SURGEON:  Magdelyn Roebuck L. Vincente Poli, MD  ANESTHESIA:  MAC with local.  FINDINGS:  3 cm right Bartholin cyst.  SPECIMENS:  Cyst wall sent to pathology.  ESTIMATED BLOOD LOSS:  100 mL.  COMPLICATIONS:  None.  DESCRIPTION OF PROCEDURE:  The patient was taken to the operating room and anesthesia was administered.  She was then placed in lithotomy position and she was prepped and draped in the usual sterile fashion. Exam under anesthesia revealed that she had a 3 cm at least enlarged right Bartholin cyst.  Marking pen was used to make kind of a linear incision along the inner aspect of the labia where I made the incision. A small linear incision was made.  I was then able to kind of go around the Bartholin cyst using sharp dissection and electrocautery.  Once we reached the base of this, I was able to cauterize the bottom where most of the vasculature was coming from and removed the cyst wall completely. I then cauterized the base.  We removed the cyst wall and sent that to pathology.  We then closed the cyst wall cavity where the cyst was removed using 2-0 interrupted and then a second layer and the subcutaneous using 3-0 Vicryl in a running stitch.  Hemostasis was very good and her skin was closed using 3-0 Vicryl.  Local was infiltrated.  I placed some Bactroban ointment on this and an ice pack immediately to the perineum.  There was no oozing noted in the recovery room.  The patient was given Toradol and she  went to recovery room in stable condition and she will be discharged home from recovery.     Majd Tissue L. Vincente Poli, M.D.     Florestine Avers  D:  09/25/2010  T:  09/26/2010  Job:  [id number]  Electronically Signed by Marcelle Overlie M.D. on 09/27/2010 05:40:45 AM

## 2010-11-03 ENCOUNTER — Other Ambulatory Visit: Payer: Self-pay | Admitting: Family

## 2010-11-03 NOTE — Telephone Encounter (Signed)
Pt left message stating she has been in and out of the GYN office numerous times in the last 3 months and is requesting a refill on her birth control. Please advise how many refills pt may get and when should pt follow up with [place] again?

## 2010-11-04 NOTE — Telephone Encounter (Signed)
Per pharmacy, pt is requesting  3 month supply. # 84 x 1 refill sent to pharmacy. Pt notified and declines to schedule appt at this time stating she will call back closer to November when she will know her schedule better.

## 2010-11-04 NOTE — Telephone Encounter (Signed)
OK to give 1 month with 5 refills.  She will need Pap smear in November.

## 2011-01-28 ENCOUNTER — Encounter: Payer: Self-pay | Admitting: Family

## 2011-01-29 ENCOUNTER — Ambulatory Visit (INDEPENDENT_AMBULATORY_CARE_PROVIDER_SITE_OTHER): Payer: 59 | Admitting: Family

## 2011-01-29 ENCOUNTER — Encounter: Payer: Self-pay | Admitting: Family

## 2011-01-29 DIAGNOSIS — R110 Nausea: Secondary | ICD-10-CM

## 2011-01-29 DIAGNOSIS — Z309 Encounter for contraceptive management, unspecified: Secondary | ICD-10-CM

## 2011-01-29 MED ORDER — OMEPRAZOLE MAGNESIUM 20 MG PO TBEC: 20.0000 mg | DELAYED_RELEASE_TABLET | Freq: Every day | ORAL | Status: DC

## 2011-01-29 MED ORDER — LEVONORGEST-ETH ESTRAD 91-DAY 0.15-0.03 &0.01 MG PO TABS: 1.0000 | ORAL_TABLET | Freq: Every day | ORAL | Status: DC

## 2011-01-29 NOTE — Patient Instructions (Signed)
Please follow up in November. Call if you continue to have break through bleeding or if your nausea does not improve.

## 2011-01-29 NOTE — Progress Notes (Signed)
  Subjective:    Patient ID: Heather Sparks, female    DOB: 1991-04-09, 19 y.o.   MRN: [id number]  HPI  Heather Sparks is a 20 yr old female who presents today for follow up.  She reports that since her last visit she underwent removal of a Bartholin's gland cyst. Since that time she also dislocated her kneecap. She is off of her oral contraceptives for about 2 weeks during that time. She has now been back on her oral contraceptive for 4 months and notes a lot of random spotting. She notes that midcycle she develops bleeding heavy enough to feel like a period. She would like to return to YAZ.  Nausea-she notes that she continues to have nausea intermittently. She noticed it especially prominent after she exercises. She did have a negative endoscopy one year ago as well as a negative ultrasound of the abdomen at that time. Review of Systems See HPI  No past medical history on file.  History   Social History  . Marital Status: Single    Spouse Name: N/A    Number of Children: 0  . Years of Education: N/A   Occupational History  .     Social History Main Topics  . Smoking status: Not on file  . Smokeless tobacco: Not on file  . Alcohol Use: No  . Drug Use: No  . Sexually Active: No   Other Topics Concern  . Not on file   Social History Narrative   Starting college at [employer] being sexually activePhysically active, healthy diet    Past Surgical History  Procedure Date  . Bartholin gland cyst excision 09/25/10    Dr Vincente Poli    Family History  Problem Relation Age of Onset  . Hypothyroidism Mother   . Diabetes Maternal Grandmother   . Heart disease Other     Allergies  Allergen Reactions  . Sulfonamide Derivatives     No current outpatient prescriptions on file prior to visit.    BP 100/74  Pulse 66  Temp(Src) 98.9 F (37.2 C) (Oral)  Resp 16  Ht 5' 7.01" (1.702 m)  Wt 136 lb (61.689 kg)  BMI 21.30 kg/m2       Objective:   Physical  Exam        Assessment & Plan:

## 2011-01-29 NOTE — Assessment & Plan Note (Signed)
Will add a proton pump inhibitor empirically to see if this helps. Previously her workup has been negative.

## 2011-01-29 NOTE — Assessment & Plan Note (Signed)
We discussed the increased risk of blood clots on YAZ, and she wishes to pursue a different birth control pill.  She is interested in a three-month cycle pill. Will add generic seasonique. She is instructed to contact us if she develops recurrent breakthrough bleeding after the first month.

## 2011-03-23 ENCOUNTER — Telehealth: Payer: Self-pay | Admitting: *Deleted

## 2011-03-23 NOTE — Telephone Encounter (Signed)
Received voice message from pt stating she had external ear pain, bumps and itching and is now on her fingers. Pt wanted to know if she could be seen on Friday.  Attempted to reach pt. Left message on cell that pt needs to be seen in the office and to call and arrange appt for Friday or earlier if she prefers.

## 2011-03-24 ENCOUNTER — Telehealth: Payer: Self-pay | Admitting: *Deleted

## 2011-03-24 NOTE — Telephone Encounter (Signed)
Left detailed message on cell re: instructions below and to call if any questions.

## 2011-03-24 NOTE — Telephone Encounter (Signed)
Pt called stating she has some itchy bumps that developed behind her ear, now has them on her fingers and leg. Advised pt of need to be seen. Pt states she will not be back until after 5pm on Friday and wanted to know if she could go to the [facility]. Spoke with Sandford Craze, NP and was advised to have pt try hydrocortisone cream and if not resolved she should be seen in our office as the clinic is for emergencies only. Left message for pt to return my call.

## 2011-05-14 ENCOUNTER — Ambulatory Visit (INDEPENDENT_AMBULATORY_CARE_PROVIDER_SITE_OTHER): Payer: 59 | Admitting: Internal Medicine

## 2011-05-14 ENCOUNTER — Encounter: Payer: Self-pay | Admitting: Internal Medicine

## 2011-05-14 VITALS — BP 100/60 | HR 76 | Temp 97.7°F | Resp 16 | Ht 67.0 in | Wt 135.0 lb

## 2011-05-14 DIAGNOSIS — R5383 Other fatigue: Secondary | ICD-10-CM | POA: Insufficient documentation

## 2011-05-14 DIAGNOSIS — F329 Major depressive disorder, single episode, unspecified: Secondary | ICD-10-CM

## 2011-05-14 DIAGNOSIS — F3289 Other specified depressive episodes: Secondary | ICD-10-CM | POA: Insufficient documentation

## 2011-05-14 DIAGNOSIS — Z309 Encounter for contraceptive management, unspecified: Secondary | ICD-10-CM

## 2011-05-14 DIAGNOSIS — IMO0001 Reserved for inherently not codable concepts without codable children: Secondary | ICD-10-CM

## 2011-05-14 DIAGNOSIS — R5381 Other malaise: Secondary | ICD-10-CM

## 2011-05-14 LAB — CBC WITH DIFFERENTIAL/PLATELET
Basophils Absolute: 0.1 10*3/uL (ref 0.0–0.1)
HCT: 44 % (ref 36.0–46.0)
Hemoglobin: 15 g/dL (ref 12.0–15.0)
Lymphocytes Relative: 45 % (ref 12–46)
Lymphs Abs: 3.7 10*3/uL (ref 0.7–4.0)
Monocytes Absolute: 0.7 10*3/uL (ref 0.1–1.0)
Neutro Abs: 3.6 10*3/uL (ref 1.7–7.7)
RBC: 4.97 MIL/uL (ref 3.87–5.11)
RDW: 13 % (ref 11.5–15.5)
WBC: 8.2 10*3/uL (ref 4.0–10.5)

## 2011-05-14 LAB — BASIC METABOLIC PANEL
BUN: 8 mg/dL (ref 6–23)
Chloride: 99 mEq/L (ref 96–112)
Potassium: 4.6 mEq/L (ref 3.5–5.3)

## 2011-05-14 LAB — HEPATIC FUNCTION PANEL
AST: 21 U/L (ref 0–37)
Albumin: 4.3 g/dL (ref 3.5–5.2)
Alkaline Phosphatase: 54 U/L (ref 39–117)
Bilirubin, Direct: 0.1 mg/dL (ref 0.0–0.3)
Total Bilirubin: 0.3 mg/dL (ref 0.3–1.2)

## 2011-05-14 MED ORDER — LEVONORGEST-ETH ESTRAD 91-DAY 0.15-0.03 &0.01 MG PO TABS: 1.0000 | ORAL_TABLET | Freq: Every day | ORAL | Status: DC

## 2011-05-14 MED ORDER — BUPROPION HCL ER (XL) 150 MG PO TB24: 150.0000 mg | ORAL_TABLET | Freq: Every day | ORAL | Status: DC

## 2011-05-14 NOTE — Assessment & Plan Note (Signed)
Obtain cbc, chem7, lft, tsh and ebv titers.

## 2011-05-14 NOTE — Assessment & Plan Note (Signed)
RF ocp.

## 2011-05-14 NOTE — Assessment & Plan Note (Signed)
Attempt wellbutrin. Schedule close follow up

## 2011-05-14 NOTE — Progress Notes (Signed)
  Subjective:    Patient ID: Heather Sparks, female    DOB: Sep 08, 1990, 20 y.o.   MRN: [id number]  HPI Pt presents to clinic for followup of multiple medical problems. Notes chronic fatigue with h/o mono exposure. Requests retesting. Had ST 2 days ago with reportedly neg rapid strep [facility] and currently taking po abx. Requests rf of ocp and has not had recent pap. Notes mild depressed mood and anxiety with multiple stressors. +insomnia. Notes h/o depression in past. No alleviating or other exacerbating factors. Taking no medication for the problem. No other complaints.  No past medical history on file. Past Surgical History  Procedure Date  . Bartholin gland cyst excision 09/25/10    Dr Vincente Poli    does not have a smoking history on file. She does not have any smokeless tobacco history on file. She reports that she does not drink alcohol or use illicit drugs. family history includes Diabetes in her maternal grandmother; Heart disease in her other; and Hypothyroidism in her mother. Allergies  Allergen Reactions  . Sulfonamide Derivatives      Review of Systems see hpi     Objective:   Physical Exam  Nursing note and vitals reviewed. Constitutional: She appears well-developed and well-nourished. No distress.  HENT:  Head: Normocephalic and atraumatic.  Right Ear: External ear normal.  Left Ear: External ear normal.  Mouth/Throat: Oropharynx is clear and moist. No oropharyngeal exudate.  Eyes: Conjunctivae are normal. Pupils are equal, round, and reactive to light. Right eye exhibits no discharge. Left eye exhibits no discharge. No scleral icterus.  Neck: Neck supple.  Cardiovascular: Normal rate, regular rhythm and normal heart sounds.  Exam reveals no gallop and no friction rub.   No murmur heard. Pulmonary/Chest: Effort normal and breath sounds normal. No respiratory distress. She has no wheezes. She has no rales.  Lymphadenopathy:    She has no cervical adenopathy.    Neurological: She is alert.  Skin: Skin is warm and dry. She is not diaphoretic.  Psychiatric: She has a normal mood and affect.          Assessment & Plan:

## 2011-05-17 LAB — EPSTEIN-BARR VIRUS VCA ANTIBODY PANEL: EBV NA IgG: 1.92 {ISR} — ABNORMAL HIGH

## 2011-05-19 ENCOUNTER — Telehealth: Payer: Self-pay | Admitting: *Deleted

## 2011-05-19 NOTE — Telephone Encounter (Signed)
Lab work is all normal.  Mono testing show history of infection in the past, but no current infection.

## 2011-05-19 NOTE — Telephone Encounter (Signed)
Pt left message requesting most recent lab results. Please advise.

## 2011-05-19 NOTE — Telephone Encounter (Signed)
Attempted to reach pt, left detailed message re: result on cell# and to call if any questions.

## 2011-06-28 ENCOUNTER — Ambulatory Visit (INDEPENDENT_AMBULATORY_CARE_PROVIDER_SITE_OTHER): Payer: 59 | Admitting: Family

## 2011-06-28 ENCOUNTER — Encounter: Payer: Self-pay | Admitting: Family

## 2011-06-28 DIAGNOSIS — N946 Dysmenorrhea, unspecified: Secondary | ICD-10-CM | POA: Insufficient documentation

## 2011-06-28 DIAGNOSIS — F329 Major depressive disorder, single episode, unspecified: Secondary | ICD-10-CM

## 2011-06-28 DIAGNOSIS — F3289 Other specified depressive episodes: Secondary | ICD-10-CM

## 2011-06-28 MED ORDER — LEVONORGESTREL-ETHINYL ESTRAD 0.1-20 MG-MCG PO TABS: 1.0000 | ORAL_TABLET | Freq: Every day | ORAL | Status: DC

## 2011-06-28 MED ORDER — SERTRALINE HCL 50 MG PO TABS: 50.0000 mg | ORAL_TABLET | Freq: Every day | ORAL | Status: DC

## 2011-06-28 NOTE — Patient Instructions (Signed)
Please follow up in 1 month.  

## 2011-06-28 NOTE — Assessment & Plan Note (Signed)
Try switching to a lower estrogen OCP and see how she feels.

## 2011-06-28 NOTE — Progress Notes (Signed)
  Subjective:    Patient ID: Heather Sparks, female    DOB: Nov 14, 1990, 21 y.o.   MRN: [id number]  HPI  Ms.  Heather Sparks is a 21 yr old female who presents today for follow up.   1) Depression- Saw Dr. Rodena Medin in November and was placed on Wellbutrin. She reports that she was reclusive prior to starting wellbutrin.  Notes that she had some good days on the wellbutrin.  However, she reports that she had some "very bad days" on the Wellbutrin with bad thoughts- "I kept thinking that there was no point to living." Had feelings of worthlessness. Reports difficulty sleeping.  Slept 5 hours last night.  Eating more than normal.  Mom has depression, but she denies family history of bipolar disorder. She denies current thoughts of suicide.  GYN cramping- this happens throughout the 3 month period, worse the last week.  She would like to be switched to something different and is OK with have menses once a month.      Review of Systems See HPI  No past medical history on file.  History   Social History  . Marital Status: Single    Spouse Name: N/A    Number of Children: 0  . Years of Education: N/A   Occupational History  .     Social History Main Topics  . Smoking status: Never Smoker   . Smokeless tobacco: Never Used  . Alcohol Use: No  . Drug Use: No  . Sexually Active: No   Other Topics Concern  . Not on file   Social History Narrative   Starting college at [employer] being sexually activePhysically active, healthy diet    Past Surgical History  Procedure Date  . Bartholin gland cyst excision 09/25/10    Dr Vincente Poli    Family History  Problem Relation Age of Onset  . Hypothyroidism Mother   . Diabetes Maternal Grandmother   . Heart disease Other     Allergies  Allergen Reactions  . Sulfonamide Derivatives     No current outpatient prescriptions on file prior to visit.    BP 100/80  Pulse 78  Temp(Src) 98 F (36.7 C) (Oral)  Resp 16  Ht 5\' 7"  (1.702 m)   Wt 134 lb (60.782 kg)  BMI 20.99 kg/m2  LMP 05/21/2011       Objective:   Physical Exam  Constitutional: She appears well-developed and well-nourished. No distress.  Cardiovascular: Normal rate and regular rhythm.   No murmur heard. Pulmonary/Chest: Effort normal and breath sounds normal. No respiratory distress. She has no wheezes. She has no rales. She exhibits no tenderness.  Abdominal: Soft. She exhibits no distension.  Psychiatric: Her behavior is normal. Judgment and thought content normal. Her speech is not rapid and/or pressured. Cognition and memory are normal.       Flat affect           Assessment & Plan:  30 minutes spent with the patient today.  >50% of this time was spent counseling pt on her depression.

## 2011-06-28 NOTE — Assessment & Plan Note (Signed)
Deteriorated.  Will give her trial of sertraline. We discussed the potential side effects of sertraline such as weight gain, nausea, fatigue and risk of suicide ideation. She is instructed to stop medication and go directly to the ED if she develops suicide ideation.  She verbalizes understanding. Start at 25 mg once daily for first week and then increase to 50mg  on week two as tolerated.

## 2011-07-29 ENCOUNTER — Other Ambulatory Visit: Payer: Self-pay | Admitting: *Deleted

## 2011-07-29 NOTE — Telephone Encounter (Signed)
Rx has been pended

## 2011-07-29 NOTE — Telephone Encounter (Signed)
Received call from pt stating Sertraline caused her to have nausea and some vomiting. She has stopped med and symptoms have resolved. Has tried 2 antidepressants and does not want to try anything else at this point. States she will discuss possible need to try an alternative when she follows up with [place] again but wishes to remain off antidepressant for a while.  States she has had a period continuously for 1 month and 1 week. Wants to go back on a 3 month OCP. Pt would like [place] to call her tomorrow to determine which pharmacy she wants medication called to as she is in college and may have family member pick med up for her. Please advise.

## 2011-07-30 MED ORDER — LEVONORGEST-ETH ESTRAD 91-DAY 0.15-0.03 &0.01 MG PO TABS: 1.0000 | ORAL_TABLET | Freq: Every day | ORAL | Status: DC

## 2011-07-30 NOTE — Telephone Encounter (Signed)
Pt notified and [facility] [address]. Advised [facility] that [facility] be able to [facility] to their system if she is not able to get it today.

## 2011-11-29 ENCOUNTER — Telehealth: Payer: Self-pay | Admitting: *Deleted

## 2011-11-29 MED ORDER — LEVONORGEST-ETH ESTRAD 91-DAY 0.15-0.03 &0.01 MG PO TABS: 1.0000 | ORAL_TABLET | Freq: Every day | ORAL | Status: DC

## 2011-11-29 NOTE — Telephone Encounter (Signed)
Pt.notified

## 2011-11-29 NOTE — Telephone Encounter (Signed)
Pt left message that she will be studying abroad and will be gone from July through December. Pt is requesting 6 month supply of OCP to last until she returns.  Please advise.

## 2011-11-29 NOTE — Telephone Encounter (Signed)
RX has been sent to her pharmacy.

## 2012-12-27 ENCOUNTER — Other Ambulatory Visit: Payer: Self-pay | Admitting: Obstetrics and Gynecology

## 2013-02-07 ENCOUNTER — Other Ambulatory Visit: Payer: Self-pay | Admitting: Obstetrics and Gynecology

## 2014-05-20 ENCOUNTER — Other Ambulatory Visit: Payer: Self-pay | Admitting: Obstetrics and Gynecology

## 2014-05-21 LAB — CYTOLOGY - PAP

## 2021-07-31 ENCOUNTER — Emergency Department: Payer: BC Managed Care – PPO

## 2021-07-31 ENCOUNTER — Encounter: Payer: Self-pay | Admitting: Emergency Medicine

## 2021-07-31 ENCOUNTER — Other Ambulatory Visit: Payer: Self-pay

## 2021-07-31 ENCOUNTER — Emergency Department
Admission: EM | Admit: 2021-07-31 | Discharge: 2021-07-31 | Disposition: A | Discharge status: Home / Self Care | Payer: BC Managed Care – PPO | Attending: Emergency Medicine | Admitting: Emergency Medicine

## 2021-07-31 DIAGNOSIS — D72829 Elevated white blood cell count, unspecified: Secondary | ICD-10-CM | POA: Diagnosis not present

## 2021-07-31 DIAGNOSIS — O99111 Other diseases of the blood and blood-forming organs and certain disorders involving the immune mechanism complicating pregnancy, first trimester: Secondary | ICD-10-CM | POA: Insufficient documentation

## 2021-07-31 DIAGNOSIS — O26891 Other specified pregnancy related conditions, first trimester: Secondary | ICD-10-CM | POA: Insufficient documentation

## 2021-07-31 DIAGNOSIS — Z3A12 12 weeks gestation of pregnancy: Secondary | ICD-10-CM | POA: Insufficient documentation

## 2021-07-31 DIAGNOSIS — R109 Unspecified abdominal pain: Secondary | ICD-10-CM | POA: Diagnosis not present

## 2021-07-31 LAB — COMPREHENSIVE METABOLIC PANEL
ALT: 18 U/L (ref 0–44)
AST: 20 U/L (ref 15–41)
Albumin: 3.7 g/dL (ref 3.5–5.0)
Alkaline Phosphatase: 65 U/L (ref 38–126)
Anion gap: 12 (ref 5–15)
BUN: 6 mg/dL (ref 6–20)
CO2: 21 mmol/L — ABNORMAL LOW (ref 22–32)
Calcium: 8.8 mg/dL — ABNORMAL LOW (ref 8.9–10.3)
Chloride: 100 mmol/L (ref 98–111)
Creatinine, Ser: 0.43 mg/dL — ABNORMAL LOW (ref 0.44–1.00)
GFR, Estimated: >60 mL/min (ref >60)
Glucose, Bld: 104 mg/dL — ABNORMAL HIGH (ref 70–99)
Potassium: 3.5 mmol/L (ref 3.5–5.1)
Sodium: 133 mmol/L — ABNORMAL LOW (ref 135–145)
Total Bilirubin: 0.2 mg/dL — ABNORMAL LOW (ref 0.3–1.2)
Total Protein: 8 g/dL (ref 6.5–8.1)

## 2021-07-31 LAB — URINALYSIS, ROUTINE W REFLEX MICROSCOPIC
Bilirubin Urine: NEGATIVE
Glucose, UA: NEGATIVE mg/dL
Hgb urine dipstick: NEGATIVE
Ketones, ur: NEGATIVE mg/dL
Leukocytes,Ua: NEGATIVE
Nitrite: NEGATIVE
Protein, ur: NEGATIVE mg/dL
Specific Gravity, Urine: 1.009 (ref 1.005–1.030)
pH: 7 (ref 5.0–8.0)

## 2021-07-31 LAB — CBC
HCT: 37.2 % (ref 36.0–46.0)
Hemoglobin: 12.9 g/dL (ref 12.0–15.0)
MCH: 29.6 pg (ref 26.0–34.0)
MCHC: 34.7 g/dL (ref 30.0–36.0)
MCV: 85.3 fL (ref 80.0–100.0)
Platelets: 257 10*3/uL (ref 150–400)
RBC: 4.36 MIL/uL (ref 3.87–5.11)
RDW: 13.2 % (ref 11.5–15.5)
WBC: 14.5 10*3/uL — ABNORMAL HIGH (ref 4.0–10.5)
nRBC: 0 % (ref 0.0–0.2)

## 2021-07-31 LAB — HCG, QUANTITATIVE, PREGNANCY: hCG, Beta Chain, Quant, S: 89999 m[IU]/mL — ABNORMAL HIGH (ref <5)

## 2021-07-31 LAB — POC URINE PREG, ED: Preg Test, Ur: POSITIVE — AB

## 2021-07-31 MED ORDER — LACTATED RINGERS IV BOLUS
1000.0000 mL | Freq: Once | INTRAVENOUS | Status: AC
  Administered 2021-07-31: 1000 mL via INTRAVENOUS

## 2021-07-31 MED ORDER — ACETAMINOPHEN 500 MG PO TABS
1000.0000 mg | ORAL_TABLET | Freq: Once | ORAL | Status: AC
  Administered 2021-07-31: 1000 mg via ORAL
  Filled 2021-07-31: qty 2

## 2021-07-31 NOTE — ED Notes (Signed)
Pt needing to urinate- ultrasound notified.

## 2021-07-31 NOTE — ED Triage Notes (Signed)
Pt here with right side flank pain that started today that got worse throughout the day. Pt is currently estimated to be [redacted] weeks pregnant. Pt denies any bleeding. Pt in NAD in triage.

## 2021-07-31 NOTE — ED Notes (Signed)
First Nurse Note:  Pt to ED via POV c/o sharp pain in the right side of her abdomen. Pt states that she is [redacted] weeks pregnant. Pain started yesterday as pressure in her right side and has gotten worse and is now a sharp pain. Pt is G3P1. Pt is in NAD.

## 2021-07-31 NOTE — ED Provider Notes (Signed)
[facility] Provider Note    Event Date/Time   First MD Initiated Contact with Patient 07/31/21 1503     (approximate)   History   Flank Pain   HPI  Heather Sparks is a 31 y.o. female with history of 1 miscarriage and as listed in EMR presents to the emergency department for treatment and evaluation of right flank pain that started yesterday.  She is also experiencing pressure on the right side of her back and abdomen.  She is approximately [redacted] weeks pregnant.  She has not yet had her initial OB/GYN visit.  She denies vaginal bleeding or abnormal discharge.  No fever, dysuria, frequency.  Pain in the right flank and side increases with deep breath and movement.      Physical Exam   Triage Vital Signs: ED Triage Vitals [07/31/21 1401]  Enc Vitals Group     BP (!) 126/103     Pulse Rate (!) 122     Resp 20     Temp 98.3 F (36.8 C)     Temp Source Oral     SpO2 100 %     Weight 150 lb (68 kg)     Height 5\' 7"  (1.702 m)     Head Circumference      Peak Flow      Pain Score 8     Pain Loc      Pain Edu?      Excl. in GC?     Most recent vital signs: Vitals:   07/31/21 1600 07/31/21 1755  BP: 115/81 121/78  Pulse: 82 81  Resp: 17 18  Temp:    SpO2: 99% 99%    General: Awake, no distress.  CV:  Good peripheral perfusion.  Resp:  Normal effort.  Abd:  No distention.  No guarding or rebound tenderness in the right upper or lower quadrants of the abdomen. Other:  No CVA tenderness   ED Results / Procedures / Treatments   Labs (all labs ordered are listed, but only abnormal results are displayed) Labs Reviewed  COMPREHENSIVE METABOLIC PANEL - Abnormal; Notable for the following components:      Result Value   Sodium 133 (*)    CO2 21 (*)    Glucose, Bld 104 (*)    Creatinine, Ser 0.43 (*)    Calcium 8.8 (*)    Total Bilirubin 0.2 (*)    All other components within normal limits  HCG, QUANTITATIVE, PREGNANCY - Abnormal; Notable for  the following components:   hCG, Beta Chain, Quant, S 89,999 (*)    All other components within normal limits  CBC - Abnormal; Notable for the following components:   WBC 14.5 (*)    All other components within normal limits  URINALYSIS, ROUTINE W REFLEX MICROSCOPIC - Abnormal; Notable for the following components:   Color, Urine YELLOW (*)    APPearance CLEAR (*)    All other components within normal limits  POC URINE PREG, ED - Abnormal; Notable for the following components:   Preg Test, Ur POSITIVE (*)    All other components within normal limits     EKG  Not indicated   RADIOLOGY  Image and radiology report reviewed by me.  Renal ultrasound negative for hydronephrosis or obstruction.  Ultrasound OB complete less than 14 weeks is consistent with a single IUP 12 weeks 1 day with fetal heart rate of 158  PROCEDURES:  Critical Care performed: No  Procedures  MEDICATIONS ORDERED IN ED: Medications  lactated ringers bolus 1,000 mL (0 mLs Intravenous Stopped 07/31/21 1634)  acetaminophen (TYLENOL) tablet 1,000 mg (1,000 mg Oral Given 07/31/21 1521)     IMPRESSION / MDM / ASSESSMENT AND PLAN / ED COURSE   I have reviewed the triage note.  Differential diagnosis includes, but is not limited to, kidney stone, ectopic pregnancy, nonspecific abdominal pain.  31 year old female presenting to the emergency department for treatment and evaluation after developing right flank pain/side pain/abdominal pain yesterday.  She is approximately [redacted] weeks pregnant.  See HPI for further details.  Plan will be to get a renal ultrasound and pelvic ultrasound.  Labs drawn while awaiting ER room assignment show a mild leukocytosis of 14.6 but are otherwise reassuring including lipase and urinalysis.  Outside record review shows that she does have an intake appointment for OB/GYN on the 14th.  She has been in communication with him today and was advised to come to the emergency department for  evaluation since they did not have any same-day appointments.  On exam, she does appear to be uncomfortable.  Plan will be to give her fluids and Tylenol initially and reevaluate pain.  Renal ultrasound is negative for hydronephrosis or obvious obstructing calculi.  OB ultrasound shows a single IUP 12 weeks 1 day with fetal heart rate of 158.  Patient is more comfortable after Tylenol and IV fluids.  She feels reassured by the ultrasound results.  She will be discharged home and encouraged to keep her intake appointment on the 14th.  For symptoms that change or worsen if she is unable to be evaluated by the OB/GYN sooner, she is to return to the emergency department.      FINAL CLINICAL IMPRESSION(S) / ED DIAGNOSES   Final diagnoses:  Acute right flank pain     Rx / DC Orders   ED Discharge Orders     None        Note:  This document was prepared using Dragon voice recognition software and may include unintentional dictation errors.   Chinita Pester, FNP 08/03/21 1238    Minna Antis, MD 08/07/21 [phone]

## 2022-04-07 IMAGING — US US OB COMP LESS 14 WK
1 series · 14 of 28 positions shown · non-contrast
Comparison: None

CLINICAL DATA: Acute RIGHT flank pain for 1 day, pregnant, LMP
05/14/2021, quantitative beta HCG 89,999

EXAM:
OBSTETRIC <14 WK ULTRASOUND
TECHNIQUE: Transabdominal ultrasound was performed for evaluation of the
gestation as well as the maternal uterus and adnexal regions.

[Series 1: early ob us · arterial · 14 of 69 slices shown]
[im 3/69]
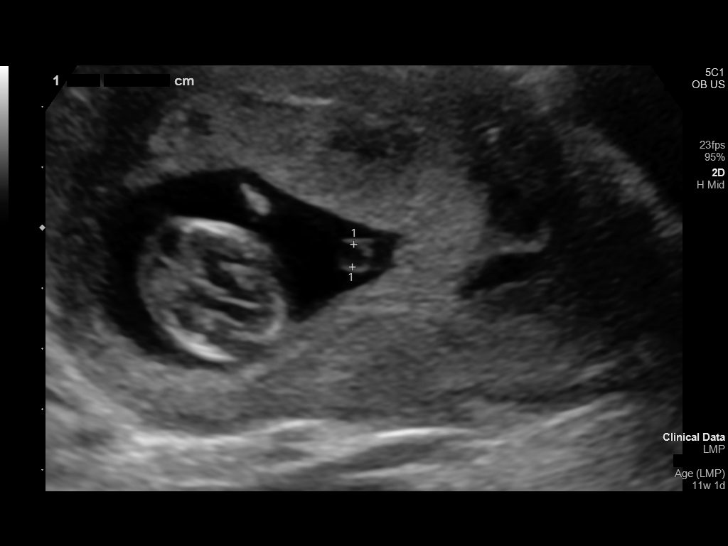
[im 8/69]
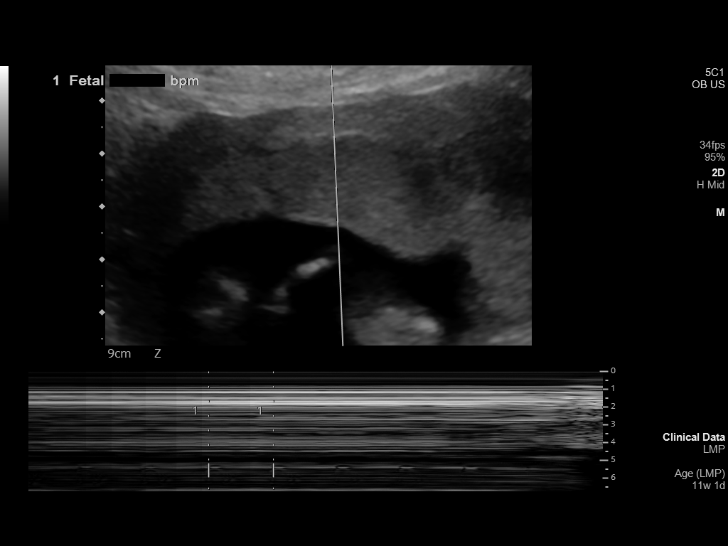
[im 13/69]
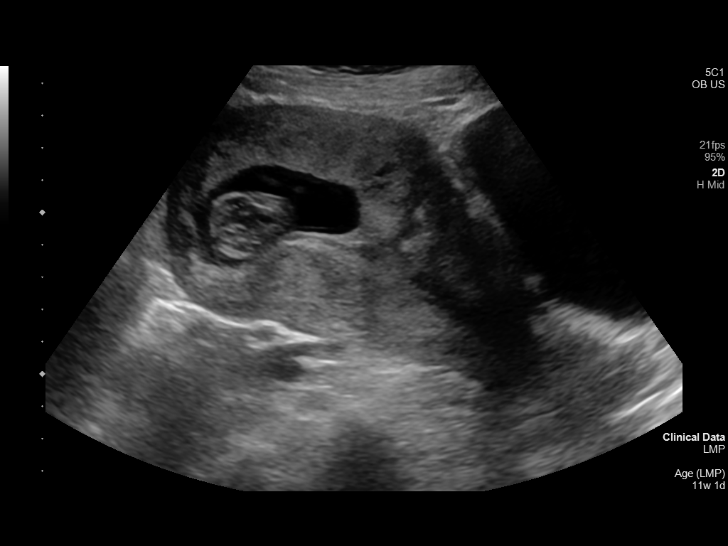
[im 18/69]
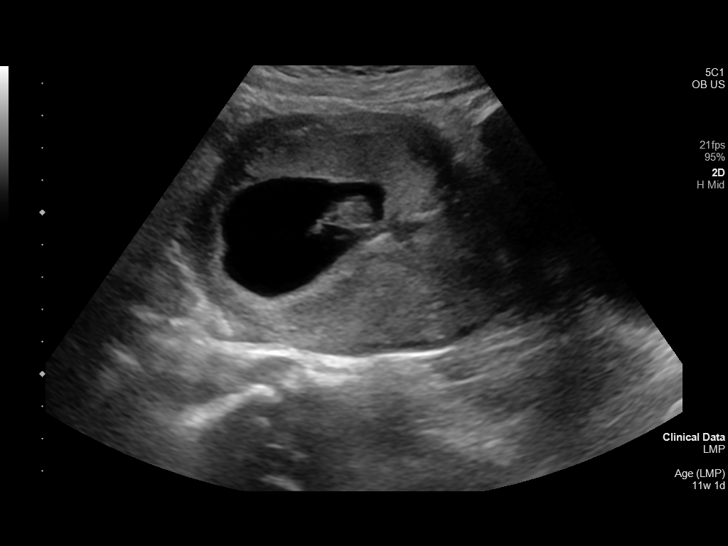
[im 23/69]
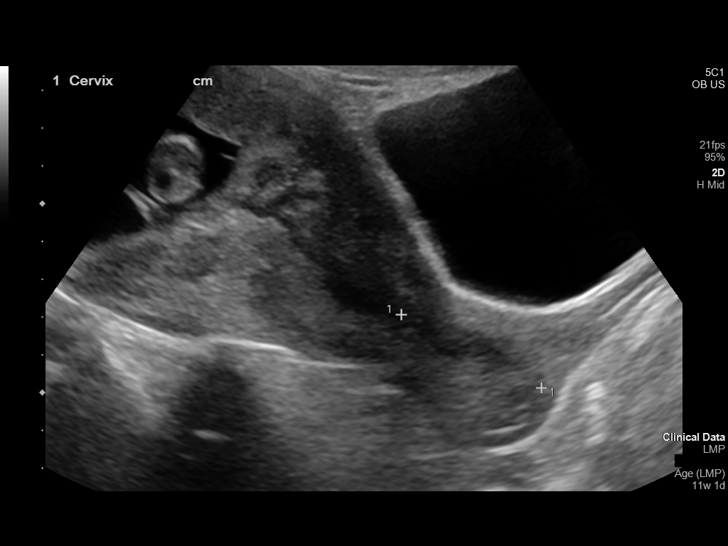
[im 28/69]
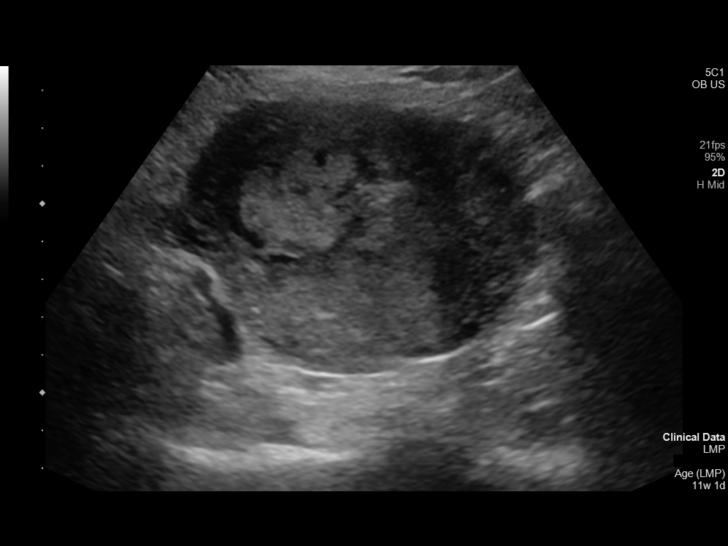
[im 33/69]
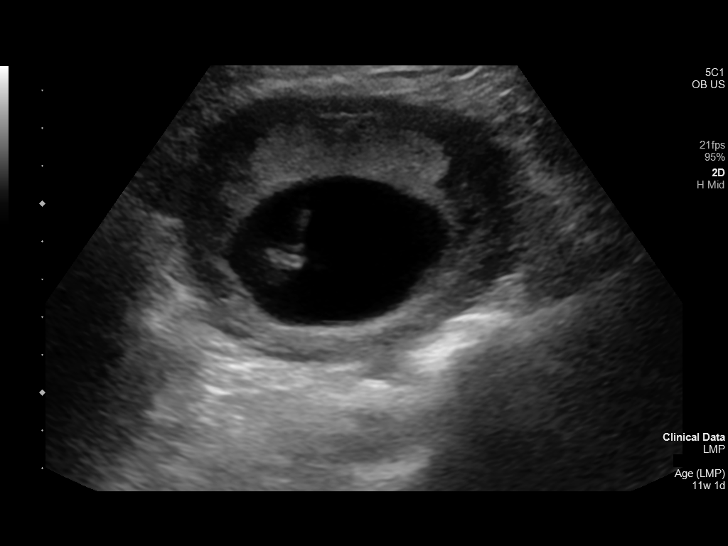
[im 38/69]
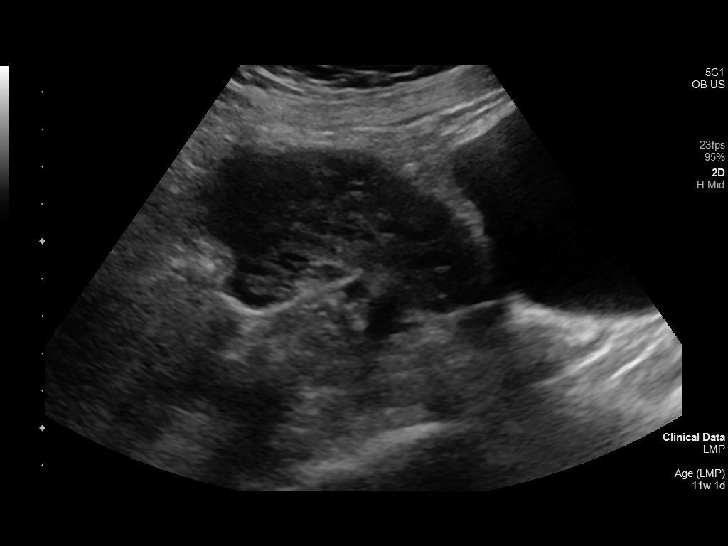
[im 43/69]
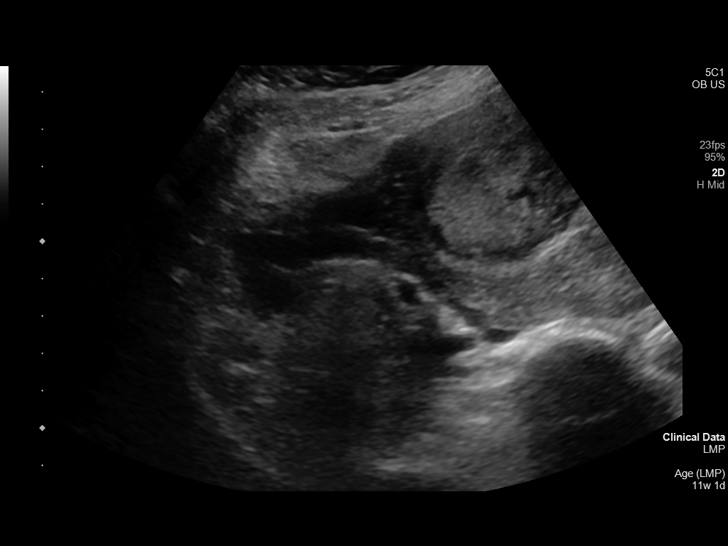
[im 48/69]
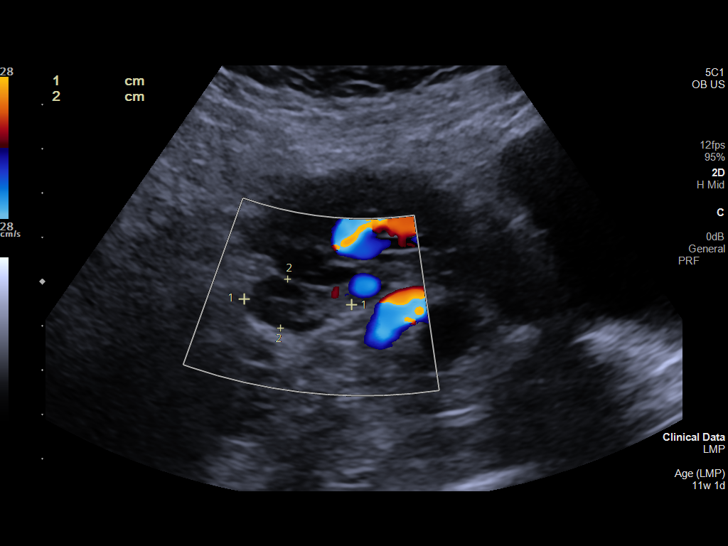
[im 53/69]
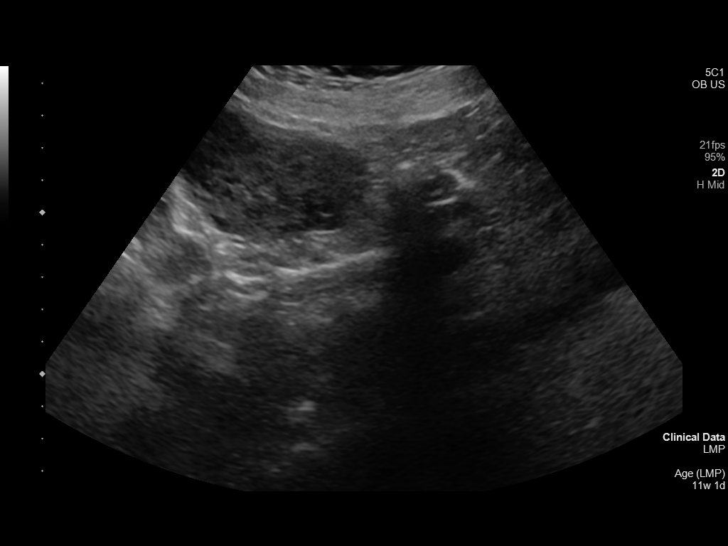
[im 58/69]
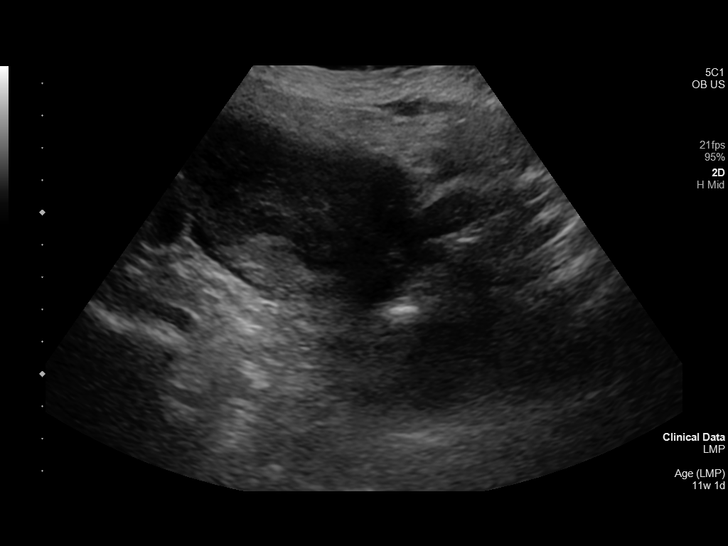
[im 63/69]
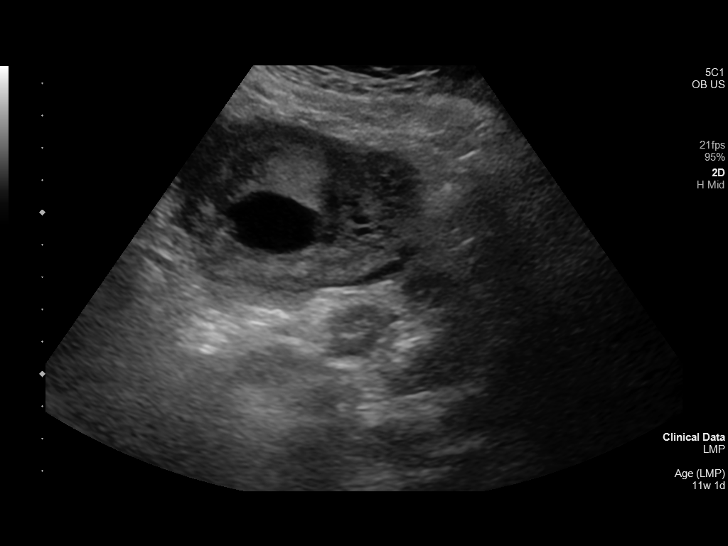
[im 69/69]
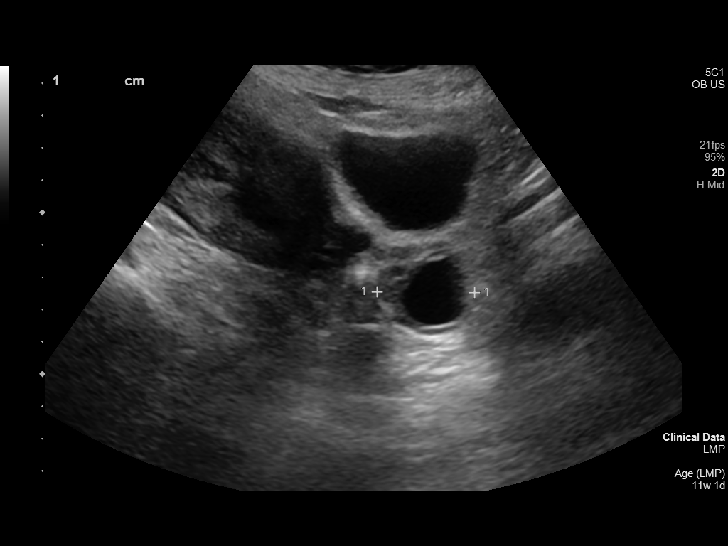

[14 of 28 positions shown; findings below may reference images not displayed]

FINDINGS: Intrauterine gestational sac: Present, single

Yolk sac:  Present

Embryo:  Present

Cardiac Activity: Present

Heart Rate: 158 bpm

CRL:   55.6 mm   12 w 1 d                  US EDC: 02/11/2022

Subchorionic hemorrhage:  None visualized.

Maternal uterus/adnexae:

Remainder of uterus unremarkable.

RIGHT ovary normal size and morphology 2.4 x 1.1 x 1.3 cm.

LEFT ovary normal size and morphology 4.4 x 2.8 x 3.0 cm, containing
a small corpus luteum.

No free pelvic fluid or adnexal masses.
IMPRESSION: Single live intrauterine gestation at 12 weeks 1 day EGA by
crown-rump length.

No acute abnormalities.

## 2022-09-11 ENCOUNTER — Ambulatory Visit
Admission: EM | Admit: 2022-09-11 | Discharge: 2022-09-11 | Disposition: A | Discharge status: Home / Self Care | Payer: BC Managed Care – PPO | Attending: Emergency Medicine | Admitting: Emergency Medicine

## 2022-09-11 ENCOUNTER — Other Ambulatory Visit: Payer: Self-pay

## 2022-09-11 DIAGNOSIS — J069 Acute upper respiratory infection, unspecified: Secondary | ICD-10-CM | POA: Insufficient documentation

## 2022-09-11 DIAGNOSIS — J020 Streptococcal pharyngitis: Secondary | ICD-10-CM | POA: Insufficient documentation

## 2022-09-11 LAB — GROUP A STREP BY PCR: Group A Strep by PCR: DETECTED — AB

## 2022-09-11 MED ORDER — BENZONATATE 100 MG PO CAPS: 200.0000 mg | ORAL_CAPSULE | Freq: Three times a day (TID) | ORAL | 0 refills | Status: DC

## 2022-09-11 MED ORDER — IPRATROPIUM BROMIDE 0.06 % NA SOLN: 2.0000 | Freq: Four times a day (QID) | NASAL | 12 refills | Status: DC

## 2022-09-11 MED ORDER — PROMETHAZINE-DM 6.25-15 MG/5ML PO SYRP: 5.0000 mL | ORAL_SOLUTION | Freq: Four times a day (QID) | ORAL | 0 refills | Status: DC | PRN

## 2022-09-11 MED ORDER — AMOXICILLIN-POT CLAVULANATE 875-125 MG PO TABS: 1.0000 | ORAL_TABLET | Freq: Two times a day (BID) | ORAL | 0 refills | Status: AC

## 2022-09-11 NOTE — ED Triage Notes (Signed)
Pt reports starting on Wednesday with sore throat and body aches. Today her head feels very congested. Low grade fever last Sunday

## 2022-09-11 NOTE — ED Provider Notes (Signed)
[facility]    CSN: [id number] Arrival date & time: 09/11/22  [id number]      History   Chief Complaint Chief Complaint  Patient presents with   Sore Throat    HPI Heather Sparks is a 32 y.o. female.   HPI  32 year old female with a past medical history significant for fatigue, depression, dysmenorrhea, and hidradenitis suppurativa presenting for evaluation of sore throat and bodyaches x 3 days.  She is also complaining of feeling congested and states that she had a low-grade fever.  History reviewed. No pertinent past medical history.  Patient Active Problem List   Diagnosis Date Noted   Dysmenorrhea 06/28/2011   Fatigue 05/14/2011   Depressive disorder, not elsewhere classified 05/14/2011   Contraceptive management 01/29/2011   Nausea 01/29/2011   HIDRADENITIS SUPPURATIVA 05/13/2010    Past Surgical History:  Procedure Laterality Date   BARTHOLIN GLAND CYST EXCISION  09/25/10   Dr Helane Rima    OB History   No obstetric history on file.      Home Medications    Prior to Admission medications   Medication Sig Start Date End Date Taking? Authorizing Provider  amoxicillin-clavulanate (AUGMENTIN) 875-125 MG tablet Take 1 tablet by mouth every 12 (twelve) hours for 10 days. 09/11/22 09/21/22 Yes Margarette Canada, NP  benzonatate (TESSALON) 100 MG capsule Take 2 capsules (200 mg total) by mouth every 8 (eight) hours. 09/11/22  Yes Margarette Canada, NP  ipratropium (ATROVENT) 0.06 % nasal spray Place 2 sprays into both nostrils 4 (four) times daily. 09/11/22  Yes Margarette Canada, NP  Multiple Vitamin (MULTIVITAMIN WITH MINERALS) TABS tablet Take 1 tablet by mouth daily.   Yes [provider]  promethazine-dextromethorphan (PROMETHAZINE-DM) 6.25-15 MG/5ML syrup Take 5 mLs by mouth 4 (four) times daily as needed. 09/11/22  Yes Margarette Canada, NP    Family History Family History  Problem Relation Age of Onset   Hypothyroidism Mother    Diabetes Maternal Grandmother    Heart  disease Other     Social History Social History   Tobacco Use   Smoking status: Never   Smokeless tobacco: Never  Vaping Use   Vaping Use: Never used  Substance Use Topics   Alcohol use: Not Currently   Drug use: Never     Allergies   Sulfonamide derivatives   Review of Systems Review of Systems  Constitutional:  Positive for fever.       Tmax of 100  HENT:  Positive for congestion, ear pain, rhinorrhea, sinus pressure and sore throat.        Significant facial and sinus pressure with bloody nasal discharge.  Respiratory:  Positive for cough. Negative for shortness of breath and wheezing.   Musculoskeletal:  Positive for arthralgias and myalgias.     Physical Exam Triage Vital Signs ED Triage Vitals  Enc Vitals Group     BP 09/11/22 0829 127/81     Pulse Rate 09/11/22 0829 90     Resp 09/11/22 0829 17     Temp 09/11/22 0829 98.3 F (36.8 C)     Temp Source 09/11/22 0829 Oral     SpO2 09/11/22 0829 98 %     Weight 09/11/22 0825 157 lb (71.2 kg)     Height 09/11/22 0825 5\' 7"  (1.702 m)     Head Circumference --      Peak Flow --      Pain Score 09/11/22 0825 5     Pain Loc --  Pain Edu? --      Excl. in [place]? --    No data found.  Updated Vital Signs BP 127/81 (BP Location: Left Arm)   Pulse 90   Temp 98.3 F (36.8 C) (Oral)   Resp 17   Ht 5\' 7"  (1.702 m)   Wt 157 lb (71.2 kg)   LMP 08/28/2022   SpO2 98%   BMI 24.59 kg/m   Visual Acuity Right Eye Distance:   Left Eye Distance:   Bilateral Distance:    Right Eye Near:   Left Eye Near:    Bilateral Near:     Physical Exam Vitals and nursing note reviewed.  Constitutional:      Appearance: Normal appearance. She is not ill-appearing.  HENT:     Head: Normocephalic and atraumatic.     Right Ear: Tympanic membrane, ear canal and external ear normal. There is no impacted cerumen.     Left Ear: Tympanic membrane, ear canal and external ear normal. There is no impacted cerumen.     Nose:  Congestion and rhinorrhea present.     Comments: Mucosa is erythematous and edematous with bloody discharge in the left nare.  Right nare is patent.    Mouth/Throat:     Mouth: Mucous membranes are moist.     Pharynx: Oropharynx is clear. Posterior oropharyngeal erythema present. No oropharyngeal exudate.     Comments: Tonsillar pillars are 1+ edematous and erythematous but free of exudate.  Soft palate is also erythematous.  Mild erythema and injection to the posterior oropharynx also noted on exam. Neck:     Comments: Bilateral tender anterior cervical of adenopathy. Cardiovascular:     Rate and Rhythm: Normal rate and regular rhythm.     Pulses: Normal pulses.     Heart sounds: Normal heart sounds. No murmur heard.    No friction rub. No gallop.  Pulmonary:     Effort: Pulmonary effort is normal.     Breath sounds: Normal breath sounds. No wheezing, rhonchi or rales.  Musculoskeletal:     Cervical back: Normal range of motion and neck supple. Tenderness present.  Lymphadenopathy:     Cervical: Cervical adenopathy present.  Skin:    General: Skin is warm and dry.     Findings: No erythema.  Neurological:     Mental Status: She is alert.      UC Treatments / Results  Labs (all labs ordered are listed, but only abnormal results are displayed) Labs Reviewed  GROUP A STREP BY PCR - Abnormal; Notable for the following components:      Result Value   Group A Strep by PCR DETECTED (*)    All other components within normal limits    EKG   Radiology No results found.  Procedures Procedures (including critical care time)  Medications Ordered in UC Medications - No data to display  Initial Impression / Assessment and Plan / UC Course  I have reviewed the triage vital signs and the nursing notes.  Pertinent labs & imaging results that were available during my care of the patient were reviewed by me and considered in my medical decision making (see chart for details).    Patient is a pleasant, nontoxic-appearing 32 year old female presenting for evaluation of respiratory symptoms as outlined HPI above.  She reports that last week and she had a GI bug that she caught from her children which completely resolved.  3 days ago she started with a sore throat which morphed into  a fever with a Tmax of 100, body aches, chills, nasal congestion and bloody nasal discharge.  She also complaining of significant sinus pressure.  She does have bloody discharge in her left nare on exam as well as an erythematous and edematous posterior oropharynx and bilateral tonsillar pillars.  Patient has signs of an upper respiratory infection on exam.  Given the erythema and edema in the posterior pharynx I will also order strep PCR.  Strep PCR is positive.  I will discharge patient home with diagnosis of strep pharyngitis and URI and start her on Augmentin 875 mg twice daily for 10 days for treatment of both.  I will also prescribe Atrovent nasal spray that she can use for the nasal congestion along with Tessalon Perles and Promethazine DM cough syrup for cough and congestion.  Return precautions reviewed.   Final Clinical Impressions(s) / UC Diagnoses   Final diagnoses:  Upper respiratory tract infection, unspecified type  Strep throat     Discharge Instructions      Take the Augmentin twice daily for 10 days for treatment of your strep throat.  Gargle with warm salt water 2-3 times a day to soothe your throat, aid in pain relief, and aid in healing.  Take over-the-counter ibuprofen according to the package instructions as needed for pain.  You can also use Chloraseptic or Sucrets lozenges, 1 lozenge every 2 hours as needed for throat pain.  Use the Atrovent nasal spray, 2 squirts in each nostril every 6 hours, as needed for runny nose and postnasal drip.  Use the Tessalon Perles every 8 hours during the day.  Take them with a small sip of water.  They may give you some numbness  to the base of your tongue or a metallic taste in your mouth, this is normal.  Use the Promethazine DM cough syrup at bedtime for cough and congestion.  It will make you drowsy so do not take it during the day.  If you develop any new or worsening symptoms return for reevaluation.      ED Prescriptions     Medication Sig Dispense Auth. Provider   amoxicillin-clavulanate (AUGMENTIN) 875-125 MG tablet Take 1 tablet by mouth every 12 (twelve) hours for 10 days. 20 tablet Margarette Canada, NP   benzonatate (TESSALON) 100 MG capsule Take 2 capsules (200 mg total) by mouth every 8 (eight) hours. 21 capsule Margarette Canada, NP   ipratropium (ATROVENT) 0.06 % nasal spray Place 2 sprays into both nostrils 4 (four) times daily. 15 mL Margarette Canada, NP   promethazine-dextromethorphan (PROMETHAZINE-DM) 6.25-15 MG/5ML syrup Take 5 mLs by mouth 4 (four) times daily as needed. 118 mL Margarette Canada, NP      PDMP not reviewed this encounter.   Margarette Canada, NP 09/11/22 [phone]

## 2022-09-11 NOTE — Discharge Instructions (Addendum)
Take the Augmentin twice daily for 10 days for treatment of your strep throat. ? ?Gargle with warm salt water 2-3 times a day to soothe your throat, aid in pain relief, and aid in healing. ? ?Take over-the-counter ibuprofen according to the package instructions as needed for pain. ? ?You can also use Chloraseptic or Sucrets lozenges, 1 lozenge every 2 hours as needed for throat pain. ? ?Use the Atrovent nasal spray, 2 squirts in each nostril every 6 hours, as needed for runny nose and postnasal drip. ? ?Use the Tessalon Perles every 8 hours during the day.  Take them with a small sip of water.  They may give you some numbness to the base of your tongue or a metallic taste in your mouth, this is normal. ? ?Use the Promethazine DM cough syrup at bedtime for cough and congestion.  It will make you drowsy so do not take it during the day. ? ?If you develop any new or worsening symptoms return for reevaluation.  ?

## 2024-07-07 ENCOUNTER — Ambulatory Visit
Admission: EM | Admit: 2024-07-07 | Discharge: 2024-07-07 | Disposition: A | Discharge status: Home / Self Care | Payer: Self-pay | Attending: Family Medicine | Admitting: Family Medicine

## 2024-07-07 ENCOUNTER — Encounter: Payer: Self-pay | Admitting: Emergency Medicine

## 2024-07-07 DIAGNOSIS — J069 Acute upper respiratory infection, unspecified: Secondary | ICD-10-CM

## 2024-07-07 LAB — POC COVID19/FLU A&B COMBO
Covid Antigen, POC: NEGATIVE
Influenza A Antigen, POC: NEGATIVE
Influenza B Antigen, POC: NEGATIVE

## 2024-07-07 LAB — POCT RAPID STREP A (OFFICE): Rapid Strep A Screen: NEGATIVE

## 2024-07-07 MED ORDER — IPRATROPIUM BROMIDE 0.06 % NA SOLN: 2.0000 | Freq: Four times a day (QID) | NASAL | 0 refills | Status: AC | PRN

## 2024-07-07 MED ORDER — CETIRIZINE-PSEUDOEPHEDRINE ER 5-120 MG PO TB12: 1.0000 | ORAL_TABLET | Freq: Two times a day (BID) | ORAL | 0 refills | Status: AC

## 2024-07-07 MED ORDER — PROMETHAZINE-DM 6.25-15 MG/5ML PO SYRP: 5.0000 mL | ORAL_SOLUTION | Freq: Four times a day (QID) | ORAL | 0 refills | Status: AC | PRN

## 2024-07-07 NOTE — ED Provider Notes (Signed)
 " [facility]    CSN: [id number] Arrival date & time: 07/07/24  1011  History   Chief Complaint Chief Complaint  Patient presents with   Sinus Problem   Cough    HPI  34 year old female presents for respiratory symptoms.  Patient report sore throat, congestion, sinus pain and pressure and associated headache.  She is also had some cough.  Symptoms started yesterday.  No documented fever.  She has taken Mucinex without relief.   Home Medications    Prior to Admission medications  Medication Sig Start Date End Date Taking? Authorizing Provider  cetirizine -pseudoephedrine  (ZYRTEC -D) 5-120 MG tablet Take 1 tablet by mouth 2 (two) times daily. 07/07/24  Yes Cathye Kreiter G, DO  ipratropium (ATROVENT ) 0.06 % nasal spray Place 2 sprays into both nostrils 4 (four) times daily as needed for rhinitis. 07/07/24  Yes Muaaz Brau G, DO  promethazine -dextromethorphan (PROMETHAZINE -DM) 6.25-15 MG/5ML syrup Take 5 mLs by mouth 4 (four) times daily as needed. 07/07/24  Yes Amirr Achord G, DO  Multiple Vitamin (MULTIVITAMIN WITH MINERALS) TABS tablet Take 1 tablet by mouth daily.    [provider]    Family History Family History  Problem Relation Age of Onset   Hypothyroidism Mother    Diabetes Maternal Grandmother    Heart disease Other     Social History Social History[1]   Allergies   Sulfonamide derivatives   Review of Systems Review of Systems  Respiratory:  Positive for cough.   Per HPI  Physical Exam Triage Vital Signs ED Triage Vitals  Encounter Vitals Group     BP 07/07/24 1039 121/83     Girls Systolic BP Percentile --      Girls Diastolic BP Percentile --      Boys Systolic BP Percentile --      Boys Diastolic BP Percentile --      Pulse Rate 07/07/24 1039 88     Resp 07/07/24 1039 14     Temp 07/07/24 1039 98.3 F (36.8 C)     Temp Source 07/07/24 1039 Oral     SpO2 07/07/24 1039 99 %     Weight 07/07/24 1037 156 lb 15.5 oz (71.2 kg)      Height 07/07/24 1037 5' 7 (1.702 m)     Head Circumference --      Peak Flow --      Pain Score 07/07/24 1037 6     Pain Loc --      Pain Education --      Exclude from Growth Chart --    No data found.  Updated Vital Signs BP 121/83 (BP Location: Right Arm)   Pulse 88   Temp 98.3 F (36.8 C) (Oral)   Resp 14   Ht 5' 7 (1.702 m)   Wt 71.2 kg   LMP 06/19/2024 (Approximate)   SpO2 99%   BMI 24.58 kg/m   Visual Acuity Right Eye Distance:   Left Eye Distance:   Bilateral Distance:    Right Eye Near:   Left Eye Near:    Bilateral Near:     Physical Exam Vitals and nursing note reviewed.  Constitutional:      General: She is not in acute distress. HENT:     Head: Normocephalic and atraumatic.     Nose: Congestion present.     Mouth/Throat:     Pharynx: Oropharynx is clear.  Cardiovascular:     Rate and Rhythm: Normal rate and regular rhythm.  Pulmonary:  Effort: Pulmonary effort is normal.     Breath sounds: Normal breath sounds. No wheezing, rhonchi or rales.  Neurological:     Mental Status: She is alert.      UC Treatments / Results  Labs (all labs ordered are listed, but only abnormal results are displayed) Labs Reviewed  POCT RAPID STREP A (OFFICE) - Normal  POC COVID19/FLU A&B COMBO - Normal    EKG   Radiology No results found.  Procedures Procedures (including critical care time)  Medications Ordered in UC Medications - No data to display  Initial Impression / Assessment and Plan / UC Course  I have reviewed the triage vital signs and the nursing notes.  Pertinent labs & imaging results that were available during my care of the patient were reviewed by me and considered in my medical decision making (see chart for details).    34 year old female presents with a viral URI.  Atrovent , Promethazine  DM, and Zyrtec -D as prescribed.  Testing negative here today.  Final Clinical Impressions(s) / UC Diagnoses   Final diagnoses:  Upper  respiratory tract infection, unspecified type   Discharge Instructions   None    ED Prescriptions     Medication Sig Dispense Auth. Provider   ipratropium (ATROVENT ) 0.06 % nasal spray Place 2 sprays into both nostrils 4 (four) times daily as needed for rhinitis. 15 mL Oluwasemilore Bahl G, DO   promethazine -dextromethorphan (PROMETHAZINE -DM) 6.25-15 MG/5ML syrup Take 5 mLs by mouth 4 (four) times daily as needed. 118 mL Jenisis Harmsen G, DO   cetirizine -pseudoephedrine  (ZYRTEC -D) 5-120 MG tablet Take 1 tablet by mouth 2 (two) times daily. 60 tablet Versa Craton G, DO      PDMP not reviewed this encounter.    [1]  Social History Tobacco Use   Smoking status: Never   Smokeless tobacco: Never  Vaping Use   Vaping status: Never Used  Substance Use Topics   Alcohol use: Not Currently   Drug use: Never     Aaran Enberg G, DO 07/07/24 1130  "

## 2024-07-07 NOTE — ED Triage Notes (Signed)
 Patient c/o sore throat, nasal congestion, sinus pressure, headache, cough, and bodyaches that started yesterday.
